# Patient Record
Sex: Female | Born: 1985
Health system: Southern US, Community
[De-identification: ages and names within clinical notes are randomized; demographics above are authoritative.]

## PROBLEM LIST (undated history)

## (undated) ENCOUNTER — Inpatient Hospital Stay (HOSPITAL_COMMUNITY): Payer: Self-pay

## (undated) DIAGNOSIS — D696 Thrombocytopenia, unspecified: Secondary | ICD-10-CM

## (undated) DIAGNOSIS — N189 Chronic kidney disease, unspecified: Secondary | ICD-10-CM

## (undated) HISTORY — PX: WISDOM TOOTH EXTRACTION: SHX21

---
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
## 2006-05-12 ENCOUNTER — Emergency Department (HOSPITAL_COMMUNITY): Admission: EM | Admit: 2006-05-12 | Discharge: 2006-05-12 | Payer: Self-pay | Admitting: Emergency Medicine

## 2010-04-11 ENCOUNTER — Encounter (INDEPENDENT_AMBULATORY_CARE_PROVIDER_SITE_OTHER): Payer: Self-pay | Admitting: Obstetrics and Gynecology

## 2010-04-11 ENCOUNTER — Inpatient Hospital Stay (HOSPITAL_COMMUNITY): Admission: RE | Admit: 2010-04-11 | Discharge: 2010-04-13 | Payer: Self-pay | Admitting: Family Medicine

## 2010-12-31 LAB — CBC
HCT: 34.8 % — ABNORMAL LOW (ref 36.0–46.0)
Hemoglobin: 12.2 g/dL (ref 12.0–15.0)
Platelets: 106 10*3/uL — ABNORMAL LOW (ref 150–400)
Platelets: 99 10*3/uL — ABNORMAL LOW (ref 150–400)
RBC: 3.54 MIL/uL — ABNORMAL LOW (ref 3.87–5.11)
RDW: 13.2 % (ref 11.5–15.5)
RDW: 13.3 % (ref 11.5–15.5)
WBC: 12.4 10*3/uL — ABNORMAL HIGH (ref 4.0–10.5)
WBC: 7.3 10*3/uL (ref 4.0–10.5)
WBC: 9.8 10*3/uL (ref 4.0–10.5)

## 2011-08-17 LAB — OB RESULTS CONSOLE GC/CHLAMYDIA: Gonorrhea: NEGATIVE

## 2011-10-16 NOTE — L&D Delivery Note (Signed)
Delivery Note At 7:45 AM a viable female was delivered via OA.  APGAR 9 and 9 Weight pending Placenta status: del spontaneously and intact with 3 vessel cord.  Cord:  with the following complications: noneCord pH: not obtained  Anesthesia:  none Episiotomy: none Lacerations: none Suture Repair: not applicable Est. Blood Loss (mL): 300  Mom to postpartum.  Baby to nursery-stable.  Jahmere Bramel L 05/08/2012, 7:59 AM

## 2011-10-17 LAB — OB RESULTS CONSOLE ABO/RH: RH Type: POSITIVE

## 2011-10-29 ENCOUNTER — Other Ambulatory Visit: Payer: Self-pay

## 2012-02-07 ENCOUNTER — Other Ambulatory Visit (HOSPITAL_COMMUNITY): Payer: Self-pay | Admitting: Obstetrics and Gynecology

## 2012-02-07 DIAGNOSIS — K311 Adult hypertrophic pyloric stenosis: Secondary | ICD-10-CM

## 2012-02-08 ENCOUNTER — Ambulatory Visit (HOSPITAL_COMMUNITY)
Admission: RE | Admit: 2012-02-08 | Discharge: 2012-02-08 | Disposition: A | Discharge status: Home / Self Care | Payer: Managed Care, Other (non HMO) | Source: Ambulatory Visit | Attending: Obstetrics and Gynecology | Admitting: Obstetrics and Gynecology

## 2012-02-08 ENCOUNTER — Encounter (HOSPITAL_COMMUNITY): Payer: Self-pay

## 2012-02-08 VITALS — BP 128/81 | HR 93 | Wt 128.0 lb

## 2012-02-08 DIAGNOSIS — Z1389 Encounter for screening for other disorder: Secondary | ICD-10-CM | POA: Insufficient documentation

## 2012-02-08 DIAGNOSIS — O358XX0 Maternal care for other (suspected) fetal abnormality and damage, not applicable or unspecified: Secondary | ICD-10-CM | POA: Insufficient documentation

## 2012-02-08 DIAGNOSIS — K311 Adult hypertrophic pyloric stenosis: Secondary | ICD-10-CM

## 2012-02-08 DIAGNOSIS — Z363 Encounter for antenatal screening for malformations: Secondary | ICD-10-CM | POA: Insufficient documentation

## 2012-02-08 NOTE — Progress Notes (Signed)
Mariah Burke was seen for ultrasound appointment today.  Please see [facility] report for details.  

## 2012-02-14 ENCOUNTER — Inpatient Hospital Stay (HOSPITAL_COMMUNITY)
Admission: AD | Admit: 2012-02-14 | Discharge: 2012-02-14 | Disposition: A | Discharge status: Home / Self Care | Payer: Managed Care, Other (non HMO) | Source: Ambulatory Visit | Attending: Obstetrics and Gynecology | Admitting: Obstetrics and Gynecology

## 2012-02-14 ENCOUNTER — Encounter (HOSPITAL_COMMUNITY): Payer: Self-pay | Admitting: *Deleted

## 2012-02-14 DIAGNOSIS — Z36 Encounter for antenatal screening of mother: Secondary | ICD-10-CM

## 2012-02-14 DIAGNOSIS — O368190 Decreased fetal movements, unspecified trimester, not applicable or unspecified: Secondary | ICD-10-CM

## 2012-02-14 DIAGNOSIS — Z3689 Encounter for other specified antenatal screening: Secondary | ICD-10-CM

## 2012-02-14 NOTE — Discharge Instructions (Signed)
Fetal Movement Counts Patient Name: __________________________________________________ Patient Due Date: ____________________ Kick counts is highly recommended in high risk pregnancies, but it is a good idea for every pregnant woman to do. Start counting fetal movements at 28 weeks of the pregnancy. Fetal movements increase after eating a full meal or eating or drinking something sweet (the blood sugar is higher). It is also important to drink plenty of fluids (well hydrated) before doing the count. Lie on your left side because it helps with the circulation or you can sit in a comfortable chair with your arms over your belly (abdomen) with no distractions around you. DOING THE COUNT  Try to do the count the same time of day each time you do it.   Mark the day and time, then see how long it takes for you to feel 10 movements (kicks, flutters, swishes, rolls). You should have at least 10 movements within 2 hours. You will most likely feel 10 movements in much less than 2 hours. If you do not, wait an hour and count again. After a couple of days you will see a pattern.   What you are looking for is a change in the pattern or not enough counts in 2 hours. Is it taking longer in time to reach 10 movements?  SEEK MEDICAL CARE IF:  You feel less than 10 counts in 2 hours. Tried twice.   No movement in one hour.   The pattern is changing or taking longer each day to reach 10 counts in 2 hours.   You feel the baby is not moving as it usually does.  Date: ____________ Movements: ____________ Start time: ____________ Finish time: ____________  Date: ____________ Movements: ____________ Start time: ____________ Finish time: ____________ Date: ____________ Movements: ____________ Start time: ____________ Finish time: ____________ Date: ____________ Movements: ____________ Start time: ____________ Finish time: ____________ Date: ____________ Movements: ____________ Start time: ____________ Finish time:  ____________ Date: ____________ Movements: ____________ Start time: ____________ Finish time: ____________ Date: ____________ Movements: ____________ Start time: ____________ Finish time: ____________ Date: ____________ Movements: ____________ Start time: ____________ Finish time: ____________  Date: ____________ Movements: ____________ Start time: ____________ Finish time: ____________ Date: ____________ Movements: ____________ Start time: ____________ Finish time: ____________ Date: ____________ Movements: ____________ Start time: ____________ Finish time: ____________ Date: ____________ Movements: ____________ Start time: ____________ Finish time: ____________ Date: ____________ Movements: ____________ Start time: ____________ Finish time: ____________ Date: ____________ Movements: ____________ Start time: ____________ Finish time: ____________ Date: ____________ Movements: ____________ Start time: ____________ Finish time: ____________  Date: ____________ Movements: ____________ Start time: ____________ Finish time: ____________ Date: ____________ Movements: ____________ Start time: ____________ Finish time: ____________ Date: ____________ Movements: ____________ Start time: ____________ Finish time: ____________ Date: ____________ Movements: ____________ Start time: ____________ Finish time: ____________ Date: ____________ Movements: ____________ Start time: ____________ Finish time: ____________ Date: ____________ Movements: ____________ Start time: ____________ Finish time: ____________ Date: ____________ Movements: ____________ Start time: ____________ Finish time: ____________  Date: ____________ Movements: ____________ Start time: ____________ Finish time: ____________ Date: ____________ Movements: ____________ Start time: ____________ Finish time: ____________ Date: ____________ Movements: ____________ Start time: ____________ Finish time: ____________ Date: ____________ Movements:  ____________ Start time: ____________ Finish time: ____________ Date: ____________ Movements: ____________ Start time: ____________ Finish time: ____________ Date: ____________ Movements: ____________ Start time: ____________ Finish time: ____________ Date: ____________ Movements: ____________ Start time: ____________ Finish time: ____________  Date: ____________ Movements: ____________ Start time: ____________ Finish time: ____________ Date: ____________ Movements: ____________ Start time: ____________ Finish time: ____________ Date: ____________ Movements: ____________ Start time:   ____________ Finish time: ____________ Date: ____________ Movements: ____________ Start time: ____________ Finish time: ____________ Date: ____________ Movements: ____________ Start time: ____________ Finish time: ____________ Date: ____________ Movements: ____________ Start time: ____________ Finish time: ____________ Date: ____________ Movements: ____________ Start time: ____________ Finish time: ____________  Date: ____________ Movements: ____________ Start time: ____________ Finish time: ____________ Date: ____________ Movements: ____________ Start time: ____________ Finish time: ____________ Date: ____________ Movements: ____________ Start time: ____________ Finish time: ____________ Date: ____________ Movements: ____________ Start time: ____________ Finish time: ____________ Date: ____________ Movements: ____________ Start time: ____________ Finish time: ____________ Date: ____________ Movements: ____________ Start time: ____________ Finish time: ____________ Date: ____________ Movements: ____________ Start time: ____________ Finish time: ____________  Date: ____________ Movements: ____________ Start time: ____________ Finish time: ____________ Date: ____________ Movements: ____________ Start time: ____________ Finish time: ____________ Date: ____________ Movements: ____________ Start time: ____________ Finish  time: ____________ Date: ____________ Movements: ____________ Start time: ____________ Finish time: ____________ Date: ____________ Movements: ____________ Start time: ____________ Finish time: ____________ Date: ____________ Movements: ____________ Start time: ____________ Finish time: ____________ Date: ____________ Movements: ____________ Start time: ____________ Finish time: ____________  Date: ____________ Movements: ____________ Start time: ____________ Finish time: ____________ Date: ____________ Movements: ____________ Start time: ____________ Finish time: ____________ Date: ____________ Movements: ____________ Start time: ____________ Finish time: ____________ Date: ____________ Movements: ____________ Start time: ____________ Finish time: ____________ Date: ____________ Movements: ____________ Start time: ____________ Finish time: ____________ Date: ____________ Movements: ____________ Start time: ____________ Finish time: ____________ Document Released: 10/31/2006 Document Revised: 09/20/2011 Document Reviewed: 05/03/2009 [employer] Patient Information 2012 [employer]. 

## 2012-02-14 NOTE — MAU Provider Note (Signed)
Chief Complaint:  Decreased Fetal Movement   First Provider Initiated Contact with Patient 02/14/12 2025      HPI  Mariah Burke is  26 y.o. G2P1001 at [redacted]w[redacted]d presents with decreased fetal movement today.  She tried drinking fluids and lying down at home but still was feeling very few movements.  She felt some movement upon arrival in MAU tonight and is feeling movement currently.  She denies LOF, vaginal bleeding, vaginal itching/burning, urinary symptoms, h/a, dizziness, n/v, or fever/chills.    Pregnancy Course: uncomplicated  Past Medical History: Past Medical History  Diagnosis Date  . No pertinent past medical history     Past Surgical History: Past Surgical History  Procedure Date  . Wisdom tooth extraction     Family History: Family History  Problem Relation Age of Onset  . Anesthesia problems Neg Hx     Social History: History  Substance Use Topics  . Smoking status: Never Smoker   . Smokeless tobacco: Never Used  . Alcohol Use: No    Allergies: No Known Allergies  Meds:  Prescriptions prior to admission  Medication Sig Dispense Refill  . Prenatal Vit-Fe Fumarate-FA (PRENATAL MULTIVITAMIN) TABS Take 1 tablet by mouth every morning.          Physical Exam  Blood pressure 135/77, pulse 94, temperature 98 F (36.7 C), temperature source Oral, resp. rate 20, height 5\' 3"  (1.6 m), weight 57.153 kg (126 lb), last menstrual period 08/15/2011. GENERAL: Well-developed, well-nourished female in no acute distress.  HEENT: normocephalic, good dentition HEART: normal rate RESP: normal effort ABDOMEN: Soft, nontender, nondistended, gravid.  EXTREMITIES: Nontender, no edema NEURO: alert and oriented  SPECULUM EXAM: Deferred    FHT:  Baseline 150, moderate variability, accelerations present, no decelerations Contractions: None   Labs: Not indicated  Imaging:  Not indicated  Assessment: Reactive NST   Plan: Called Dr Marcelle Overlie to review assessment and  tracing D/C home Reviewed fetal kick counts F/U in office Return to MAU as needed   LEFTWICH-KIRBY, Phylis Javed 5/2/20138:26 PM

## 2012-02-14 NOTE — MAU Note (Signed)
Decreased fetal movement today.  Felt it this morning, just not as much during the day.  Since arrived has felt some.

## 2012-03-14 ENCOUNTER — Ambulatory Visit (HOSPITAL_COMMUNITY)
Admission: RE | Admit: 2012-03-14 | Discharge: 2012-03-14 | Disposition: A | Discharge status: Home / Self Care | Payer: Managed Care, Other (non HMO) | Source: Ambulatory Visit | Attending: Obstetrics and Gynecology | Admitting: Obstetrics and Gynecology

## 2012-03-14 DIAGNOSIS — O358XX0 Maternal care for other (suspected) fetal abnormality and damage, not applicable or unspecified: Secondary | ICD-10-CM | POA: Insufficient documentation

## 2012-03-14 DIAGNOSIS — Z3689 Encounter for other specified antenatal screening: Secondary | ICD-10-CM | POA: Insufficient documentation

## 2012-03-14 NOTE — Progress Notes (Signed)
Patient seen today  for follow up ultrasound.  See full report in [facility].  Alpha Gula, MD  The patient returns for follow up due to "enlarged stomach".  The stomach appears to be prominent, but there is no evidence of dilated bowel or polyhydramnios.  The fetal anatomic survey is otherwise within normal limits.  Single IUP at 30 2/7 weeks Interval growth is appropriate (65th %) Normal amniotic fluid volume Prominent stomach bubble, but no evidence of obstruction / polyhydramnios  Recommend follow up ultrasound as clinically indicated

## 2012-04-23 LAB — OB RESULTS CONSOLE GBS: GBS: NEGATIVE

## 2012-05-08 ENCOUNTER — Encounter (HOSPITAL_COMMUNITY): Payer: Self-pay | Admitting: *Deleted

## 2012-05-08 ENCOUNTER — Encounter (HOSPITAL_COMMUNITY): Payer: Self-pay | Admitting: Anesthesiology

## 2012-05-08 ENCOUNTER — Inpatient Hospital Stay (HOSPITAL_COMMUNITY)
Admission: AD | Admit: 2012-05-08 | Discharge: 2012-05-09 | DRG: 775 | Disposition: A | Discharge status: Home / Self Care | Payer: Managed Care, Other (non HMO) | Source: Ambulatory Visit | Attending: Obstetrics and Gynecology | Admitting: Obstetrics and Gynecology

## 2012-05-08 HISTORY — DX: Thrombocytopenia, unspecified: D69.6

## 2012-05-08 LAB — CBC
Hemoglobin: 14.4 g/dL (ref 12.0–15.0)
MCH: 31.2 pg (ref 26.0–34.0)
MCHC: 34.3 g/dL (ref 30.0–36.0)
Platelets: 107 10*3/uL — ABNORMAL LOW (ref 150–400)
RBC: 4.61 MIL/uL (ref 3.87–5.11)

## 2012-05-08 LAB — RPR: RPR Ser Ql: NONREACTIVE

## 2012-05-08 MED ORDER — LACTATED RINGERS IV SOLN: 500.0000 mL | INTRAVENOUS | Status: DC | PRN

## 2012-05-08 MED ORDER — OXYCODONE-ACETAMINOPHEN 5-325 MG PO TABS: 1.0000 | ORAL_TABLET | ORAL | Status: DC | PRN

## 2012-05-08 MED ORDER — FLEET ENEMA 7-19 GM/118ML RE ENEM: 1.0000 | ENEMA | Freq: Every day | RECTAL | Status: DC | PRN

## 2012-05-08 MED ORDER — FLEET ENEMA 7-19 GM/118ML RE ENEM: 1.0000 | ENEMA | RECTAL | Status: DC | PRN

## 2012-05-08 MED ORDER — SIMETHICONE 80 MG PO CHEW: 80.0000 mg | CHEWABLE_TABLET | ORAL | Status: DC | PRN

## 2012-05-08 MED ORDER — MEASLES, MUMPS & RUBELLA VAC ~~LOC~~ INJ
0.5000 mL | INJECTION | Freq: Once | SUBCUTANEOUS | Status: DC
  Filled 2012-05-08: qty 0.5

## 2012-05-08 MED ORDER — IBUPROFEN 600 MG PO TABS
600.0000 mg | ORAL_TABLET | Freq: Four times a day (QID) | ORAL | Status: DC
  Administered 2012-05-08 – 2012-05-09 (×4): 600 mg via ORAL
  Filled 2012-05-08 (×5): qty 1

## 2012-05-08 MED ORDER — LIDOCAINE HCL (PF) 1 % IJ SOLN
30.0000 mL | INTRAMUSCULAR | Status: DC | PRN
  Administered 2012-05-08: 30 mL via SUBCUTANEOUS
  Filled 2012-05-08: qty 30

## 2012-05-08 MED ORDER — ONDANSETRON HCL 4 MG/2ML IJ SOLN: 4.0000 mg | INTRAMUSCULAR | Status: DC | PRN

## 2012-05-08 MED ORDER — PRENATAL MULTIVITAMIN CH
1.0000 | ORAL_TABLET | Freq: Every day | ORAL | Status: DC
  Administered 2012-05-09: 1 via ORAL
  Filled 2012-05-08: qty 1

## 2012-05-08 MED ORDER — ONDANSETRON HCL 4 MG/2ML IJ SOLN: 4.0000 mg | Freq: Four times a day (QID) | INTRAMUSCULAR | Status: DC | PRN

## 2012-05-08 MED ORDER — FENTANYL 2.5 MCG/ML BUPIVACAINE 1/10 % EPIDURAL INFUSION (WH - ANES)
14.0000 mL/h | INTRAMUSCULAR | Status: DC
  Filled 2012-05-08: qty 60

## 2012-05-08 MED ORDER — CITRIC ACID-SODIUM CITRATE 334-500 MG/5ML PO SOLN: 30.0000 mL | ORAL | Status: DC | PRN

## 2012-05-08 MED ORDER — PHENYLEPHRINE 40 MCG/ML (10ML) SYRINGE FOR IV PUSH (FOR BLOOD PRESSURE SUPPORT)
80.0000 ug | PREFILLED_SYRINGE | INTRAVENOUS | Status: DC | PRN
  Filled 2012-05-08: qty 5

## 2012-05-08 MED ORDER — DIPHENHYDRAMINE HCL 25 MG PO CAPS: 25.0000 mg | ORAL_CAPSULE | Freq: Four times a day (QID) | ORAL | Status: DC | PRN

## 2012-05-08 MED ORDER — DIPHENHYDRAMINE HCL 50 MG/ML IJ SOLN: 12.5000 mg | INTRAMUSCULAR | Status: DC | PRN

## 2012-05-08 MED ORDER — EPHEDRINE 5 MG/ML INJ: 10.0000 mg | INTRAVENOUS | Status: DC | PRN

## 2012-05-08 MED ORDER — EPHEDRINE 5 MG/ML INJ
10.0000 mg | INTRAVENOUS | Status: DC | PRN
  Filled 2012-05-08: qty 4

## 2012-05-08 MED ORDER — WITCH HAZEL-GLYCERIN EX PADS: 1.0000 "application " | MEDICATED_PAD | CUTANEOUS | Status: DC | PRN

## 2012-05-08 MED ORDER — BENZOCAINE-MENTHOL 20-0.5 % EX AERO
1.0000 "application " | INHALATION_SPRAY | CUTANEOUS | Status: DC | PRN
  Administered 2012-05-08: 1 via TOPICAL
  Filled 2012-05-08: qty 56

## 2012-05-08 MED ORDER — OXYCODONE-ACETAMINOPHEN 5-325 MG PO TABS
1.0000 | ORAL_TABLET | ORAL | Status: DC | PRN
Start: 2012-05-08 — End: 2012-05-09

## 2012-05-08 MED ORDER — ZOLPIDEM TARTRATE 5 MG PO TABS: 5.0000 mg | ORAL_TABLET | Freq: Every evening | ORAL | Status: DC | PRN

## 2012-05-08 MED ORDER — IBUPROFEN 600 MG PO TABS
600.0000 mg | ORAL_TABLET | Freq: Four times a day (QID) | ORAL | Status: DC | PRN
  Administered 2012-05-08: 600 mg via ORAL
  Filled 2012-05-08: qty 1

## 2012-05-08 MED ORDER — OXYTOCIN BOLUS FROM INFUSION
250.0000 mL | Freq: Once | INTRAVENOUS | Status: AC
  Administered 2012-05-08: 250 mL via INTRAVENOUS
  Filled 2012-05-08: qty 500

## 2012-05-08 MED ORDER — LACTATED RINGERS IV SOLN: INTRAVENOUS | Status: DC

## 2012-05-08 MED ORDER — MEDROXYPROGESTERONE ACETATE 150 MG/ML IM SUSP: 150.0000 mg | INTRAMUSCULAR | Status: DC | PRN

## 2012-05-08 MED ORDER — LANOLIN HYDROUS EX OINT: TOPICAL_OINTMENT | CUTANEOUS | Status: DC | PRN

## 2012-05-08 MED ORDER — LACTATED RINGERS IV SOLN
500.0000 mL | Freq: Once | INTRAVENOUS | Status: AC
  Administered 2012-05-08: 1000 mL via INTRAVENOUS

## 2012-05-08 MED ORDER — OXYTOCIN 40 UNITS IN LACTATED RINGERS INFUSION - SIMPLE MED
62.5000 mL/h | Freq: Once | INTRAVENOUS | Status: AC
  Administered 2012-05-08: 62.5 mL/h via INTRAVENOUS
  Filled 2012-05-08: qty 1000

## 2012-05-08 MED ORDER — TETANUS-DIPHTH-ACELL PERTUSSIS 5-2.5-18.5 LF-MCG/0.5 IM SUSP: 0.5000 mL | Freq: Once | INTRAMUSCULAR | Status: DC

## 2012-05-08 MED ORDER — ACETAMINOPHEN 325 MG PO TABS: 650.0000 mg | ORAL_TABLET | ORAL | Status: DC | PRN

## 2012-05-08 MED ORDER — BISACODYL 10 MG RE SUPP: 10.0000 mg | Freq: Every day | RECTAL | Status: DC | PRN

## 2012-05-08 MED ORDER — ONDANSETRON HCL 4 MG PO TABS: 4.0000 mg | ORAL_TABLET | ORAL | Status: DC | PRN

## 2012-05-08 MED ORDER — PHENYLEPHRINE 40 MCG/ML (10ML) SYRINGE FOR IV PUSH (FOR BLOOD PRESSURE SUPPORT): 80.0000 ug | PREFILLED_SYRINGE | INTRAVENOUS | Status: DC | PRN

## 2012-05-08 MED ORDER — DIBUCAINE 1 % RE OINT: 1.0000 "application " | TOPICAL_OINTMENT | RECTAL | Status: DC | PRN

## 2012-05-08 MED ORDER — SENNOSIDES-DOCUSATE SODIUM 8.6-50 MG PO TABS
2.0000 | ORAL_TABLET | Freq: Every day | ORAL | Status: DC
  Administered 2012-05-08: 2 via ORAL

## 2012-05-08 NOTE — H&P (Signed)
26 year old G 2 P 1001 at 13 w 1 days presents with SROM this am. She has progressed to 7 cm and is now waiting for an epidural. PNC significant for being followed by MFM for dilation of fetal stomach. NKDA  Afebrile Vital signs stable Fetal heart rate is reactive General alert and oriented Lung CTAB Car RRR Abdomen is soft and non tender Cervix is 7 cm per nurse  IMPRESSION: IUP at 38 w 1 day Labor  PLAN: Epidural  Anticipate SVD

## 2012-05-08 NOTE — MAU Note (Signed)
Contractions every 2-3 mins. Water broke 420 clear fluid

## 2012-05-08 NOTE — Anesthesia Preprocedure Evaluation (Deleted)

## 2012-05-09 ENCOUNTER — Encounter (HOSPITAL_COMMUNITY): Payer: Self-pay

## 2012-05-09 LAB — CBC
HCT: 33 % — ABNORMAL LOW (ref 36.0–46.0)
MCH: 32 pg (ref 26.0–34.0)
MCHC: 34.2 g/dL (ref 30.0–36.0)
MCV: 93.5 fL (ref 78.0–100.0)
Platelets: 98 10*3/uL — ABNORMAL LOW (ref 150–400)
RDW: 13.8 % (ref 11.5–15.5)
WBC: 8.6 10*3/uL (ref 4.0–10.5)

## 2012-05-09 NOTE — Discharge Summary (Signed)
Obstetric Discharge Summary Reason for Admission: rupture of membranes Prenatal Procedures: ultrasound Intrapartum Procedures: spontaneous vaginal delivery Postpartum Procedures: none Complications-Operative and Postpartum: none Hemoglobin  Date Value Range Status  05/09/2012 11.1* 12.0 - 15.0 g/dL Final     DELTA CHECK NOTED     REPEATED TO VERIFY     HCT  Date Value Range Status  05/09/2012 33.0* 36.0 - 46.0 % Final    Physical Exam:  General: alert and cooperative Lochia: appropriate Uterine Fundus: firm Incision: perineum intact DVT Evaluation: No evidence of DVT seen on physical exam.  Discharge Diagnoses: Term Pregnancy-delivered  Discharge Information: Date: 05/09/2012 Activity: pelvic rest Diet: routine Medications: PNV Condition: stable Instructions: refer to practice specific booklet Discharge to: home RTO in 2-3 days for CBC  Newborn Data: Live born female  Birth Weight: 6 lb 11.9 oz (3060 g) APGAR: 9, 9  Home with mother.  Mariah Burke G 05/09/2012, 8:00 AM

## 2012-05-09 NOTE — Progress Notes (Signed)
Post Partum Day 1 Subjective: no complaints, up ad lib, voiding, tolerating PO and + flatus  Objective: Blood pressure 116/79, pulse 74, temperature 98.2 F (36.8 C), temperature source Oral, resp. rate 18, height 5\' 3"  (1.6 m), weight 63.504 kg (140 lb), last menstrual period 08/15/2011, SpO2 98.00%, unknown if currently breastfeeding.  Physical Exam:  General: alert and cooperative Lochia: appropriate Uterine Fundus: firm Incision: perineum intact DVT Evaluation: No evidence of DVT seen on physical exam.   Basename 05/09/12 0525 05/08/12 0652  HGB 11.1* 14.4  HCT 33.0* 42.0    Assessment/Plan: Discharge home   LOS: 1 day   Allyana Vogan G 05/09/2012, 7:51 AM

## 2012-05-14 ENCOUNTER — Inpatient Hospital Stay (HOSPITAL_COMMUNITY): Admission: RE | Admit: 2012-05-14 | Payer: Managed Care, Other (non HMO) | Source: Ambulatory Visit

## 2014-08-16 ENCOUNTER — Encounter (HOSPITAL_COMMUNITY): Payer: Self-pay

## 2014-10-15 DIAGNOSIS — D696 Thrombocytopenia, unspecified: Secondary | ICD-10-CM

## 2014-10-15 HISTORY — DX: Thrombocytopenia, unspecified: D69.6

## 2014-10-15 NOTE — L&D Delivery Note (Signed)
Delivery Note At  a viable female was delivered via NSSD (Presentation:OA ;  ).  APGAR:8/9 , ; weight  .   Placenta status: Intact, Spontaneous.3 vessel  Cord:  with the following complications:none .  Cord pH: na  Anesthesia:none   Episiotomy:  none Lacerations:  none Suture Repair: na Est. Blood Loss (mL): 350 cc   Mom to postpartum.  Baby to Couplet care / Skin to Skin.  Noa Constante S 08/02/2015, 12:17 AM

## 2014-11-17 ENCOUNTER — Encounter (HOSPITAL_COMMUNITY): Payer: Self-pay | Admitting: Emergency Medicine

## 2014-11-17 ENCOUNTER — Emergency Department (HOSPITAL_COMMUNITY)
Admission: EM | Admit: 2014-11-17 | Discharge: 2014-11-17 | Disposition: A | Discharge status: Home / Self Care | Payer: Self-pay | Source: Home / Self Care | Attending: Emergency Medicine | Admitting: Emergency Medicine

## 2014-11-17 ENCOUNTER — Emergency Department (INDEPENDENT_AMBULATORY_CARE_PROVIDER_SITE_OTHER): Payer: Self-pay

## 2014-11-17 DIAGNOSIS — J189 Pneumonia, unspecified organism: Secondary | ICD-10-CM

## 2014-11-17 MED ORDER — AZITHROMYCIN 250 MG PO TABS: 250.0000 mg | ORAL_TABLET | Freq: Every day | ORAL | Status: DC

## 2014-11-17 NOTE — ED Notes (Signed)
C/o cold sx onset 1 month but last 2 weeks have been getting worse Started out w/a dry cough on 1/3; left side started to hurt due to cough Sx also include SOB, runny nose, facial pressure, BA, fevers Taking OTC ibup/tyle w/temp relief Alert, no signs of acute distress.

## 2014-11-17 NOTE — ED Provider Notes (Signed)
CSN: [id number]     Arrival date & time 11/17/14  1223 History   First MD Initiated Contact with Patient 11/17/14 1245     Chief Complaint  Patient presents with  . URI   (Consider location/radiation/quality/duration/timing/severity/associated sxs/prior Treatment) HPI Comments: Patient reports she has had a cough for about 5 weeks. States initially symptoms began as common cold with cough, rhinorrhea, fatigue, myalgias. These symptoms lasted about a week to 10 days and then improved with exception of persistent cough. Now states that over past 1 week she feels unwell again with return of fatigue and myalgias, fever and worsening cough. Reports daughters ill with same about 2 weeks ago.  Non smoker Has not sought evaluated by PCP No flu shot this year.   Patient is a 29 y.o. female presenting with URI. The history is provided by the patient.  URI Presenting symptoms: congestion, cough, fatigue, fever and rhinorrhea     Past Medical History  Diagnosis Date  . Thrombocytopenia    Past Surgical History  Procedure Laterality Date  . Wisdom tooth extraction     Family History  Problem Relation Age of Onset  . Anesthesia problems Neg Hx    History  Substance Use Topics  . Smoking status: Never Smoker   . Smokeless tobacco: Never Used  . Alcohol Use: No   OB History    Gravida Para Term Preterm AB TAB SAB Ectopic Multiple Living   2 2 2  0 0 0 0 0 0 2     Review of Systems  Constitutional: Positive for fever, chills and fatigue.  HENT: Positive for congestion and rhinorrhea.   Respiratory: Positive for cough and shortness of breath.   Cardiovascular: Negative for chest pain.  Gastrointestinal: Negative.   Skin: Negative.     Allergies  Review of patient's allergies indicates no known allergies.  Home Medications   Prior to Admission medications   Medication Sig Start Date End Date Taking? Authorizing Provider  calcium carbonate (TUMS - DOSED IN MG ELEMENTAL CALCIUM) 500  MG chewable tablet Chew 1 tablet by mouth daily as needed. For heartburn    Historical Provider, MD  Prenatal Vit-Fe Fumarate-FA (PRENATAL MULTIVITAMIN) TABS Take 1 tablet by mouth every morning.    Historical Provider, MD   BP 125/77 mmHg  Pulse 102  Temp(Src) 98.4 F (36.9 C) (Oral)  Resp 12  SpO2 100%  LMP 10/27/2014 Physical Exam  Constitutional: She is oriented to person, place, and time. She appears well-developed and well-nourished. No distress.  HENT:  Head: Normocephalic and atraumatic.  Right Ear: Hearing, tympanic membrane, external ear and ear canal normal.  Left Ear: Hearing, tympanic membrane, external ear and ear canal normal.  Nose: Nose normal.  Mouth/Throat: Uvula is midline, oropharynx is clear and moist and mucous membranes are normal.  Eyes: Conjunctivae are normal. No scleral icterus.  Neck: Normal range of motion. Neck supple.  Cardiovascular: Normal rate, regular rhythm and normal heart sounds.   Pulmonary/Chest: Effort normal and breath sounds normal. No respiratory distress. She has no wheezes.  Abdominal: Soft. Bowel sounds are normal. She exhibits no distension. There is no tenderness.  Musculoskeletal: Normal range of motion.  Lymphadenopathy:    She has no cervical adenopathy.  Neurological: She is alert and oriented to person, place, and time.  Skin: Skin is warm and dry. No rash noted. No erythema.  Psychiatric: She has a normal mood and affect. Her behavior is normal.  Nursing note and vitals reviewed.  ED Course  Procedures (including critical care time) Labs Review Labs Reviewed  BORDETELLA PERTUSSIS PCR    Imaging Review Dg Chest 2 View  11/17/2014   CLINICAL DATA:  cough for five weeks, then a cold set in two weeks ago, fever last night 100.7. Non-smoker. Patient is not a diabetic. No history of cardiac or respiratory disease.  EXAM: CHEST - 2 VIEW  COMPARISON:  None available  FINDINGS: Lungs are hyperinflated with somewhat attenuated  coarse bronchovascular markings. Patchy left infrahilar airspace disease, partially obscuring the left diaphragmatic leaflet on the lateral projection. No overt edema. Heart size normal. No pneumothorax. No effusion. Visualized skeletal structures are unremarkable.  IMPRESSION: 1. Patchy left infrahilar airspace opacity, possibly early pneumonia. 2. Hyperinflation with coarse chronic appearing interstitial changes.   Electronically Signed   By: Arne Cleveland M.D.   On: 11/17/2014 13:42     MDM   1. CAP (community acquired pneumonia)   Given length of illness, swab sent to lab for pertussis. Patient to begin treatment for early CAP with 5 day course of azithromycin. Advised Delsym as directed on packaging for cough and PCP follow up if no improvement.     Lutricia Feil, [place] 11/17/14 1359

## 2014-11-17 NOTE — Discharge Instructions (Signed)

## 2014-11-22 LAB — BORDETELLA PERTUSSIS PCR
B parapertussis, DNA: NOT DETECTED
B pertussis, DNA: NOT DETECTED

## 2014-12-29 LAB — OB RESULTS CONSOLE RPR: RPR: NONREACTIVE

## 2014-12-29 LAB — OB RESULTS CONSOLE HIV ANTIBODY (ROUTINE TESTING): HIV: NONREACTIVE

## 2014-12-29 LAB — OB RESULTS CONSOLE ANTIBODY SCREEN: ANTIBODY SCREEN: NEGATIVE

## 2014-12-29 LAB — OB RESULTS CONSOLE ABO/RH: RH Type: POSITIVE

## 2014-12-29 LAB — OB RESULTS CONSOLE RUBELLA ANTIBODY, IGM: RUBELLA: IMMUNE

## 2014-12-29 LAB — OB RESULTS CONSOLE HEPATITIS B SURFACE ANTIGEN: Hepatitis B Surface Ag: NEGATIVE

## 2015-01-13 ENCOUNTER — Other Ambulatory Visit: Payer: Self-pay | Admitting: Obstetrics and Gynecology

## 2015-01-13 LAB — OB RESULTS CONSOLE GC/CHLAMYDIA
CHLAMYDIA, DNA PROBE: NEGATIVE
GC PROBE AMP, GENITAL: NEGATIVE

## 2015-01-14 LAB — CYTOLOGY - PAP

## 2015-07-18 ENCOUNTER — Telehealth: Payer: Self-pay | Admitting: Hematology

## 2015-07-18 ENCOUNTER — Ambulatory Visit (HOSPITAL_BASED_OUTPATIENT_CLINIC_OR_DEPARTMENT_OTHER): Payer: Self-pay | Admitting: Hematology

## 2015-07-18 ENCOUNTER — Encounter: Payer: Self-pay | Admitting: Hematology

## 2015-07-18 ENCOUNTER — Other Ambulatory Visit (HOSPITAL_BASED_OUTPATIENT_CLINIC_OR_DEPARTMENT_OTHER): Payer: Managed Care, Other (non HMO)

## 2015-07-18 VITALS — BP 125/73 | HR 97 | Temp 98.2°F | Resp 20 | Ht 63.0 in | Wt 160.9 lb

## 2015-07-18 DIAGNOSIS — O99113 Other diseases of the blood and blood-forming organs and certain disorders involving the immune mechanism complicating pregnancy, third trimester: Secondary | ICD-10-CM

## 2015-07-18 DIAGNOSIS — D696 Thrombocytopenia, unspecified: Secondary | ICD-10-CM

## 2015-07-18 LAB — CBC & DIFF AND RETIC
BASO%: 0.1 % (ref 0.0–2.0)
Basophils Absolute: 0 10*3/uL (ref 0.0–0.1)
EOS ABS: 0 10*3/uL (ref 0.0–0.5)
EOS%: 0.4 % (ref 0.0–7.0)
HCT: 38.3 % (ref 34.8–46.6)
HEMOGLOBIN: 13.3 g/dL (ref 11.6–15.9)
IMMATURE RETIC FRACT: 3.9 % (ref 1.60–10.00)
LYMPH%: 23.6 % (ref 14.0–49.7)
MCH: 32.1 pg (ref 25.1–34.0)
MCHC: 34.7 g/dL (ref 31.5–36.0)
MCV: 92.5 fL (ref 79.5–101.0)
MONO#: 0.6 10*3/uL (ref 0.1–0.9)
MONO%: 7.9 % (ref 0.0–14.0)
NEUT#: 4.7 10*3/uL (ref 1.5–6.5)
NEUT%: 68 % (ref 38.4–76.8)
PLATELETS: 105 10*3/uL — AB (ref 145–400)
RBC: 4.14 10*6/uL (ref 3.70–5.45)
RDW: 14 % (ref 11.2–14.5)
Retic %: 3.1 % — ABNORMAL HIGH (ref 0.70–2.10)
Retic Ct Abs: 128.34 10*3/uL — ABNORMAL HIGH (ref 33.70–90.70)
WBC: 6.9 10*3/uL (ref 3.9–10.3)
lymph#: 1.6 10*3/uL (ref 0.9–3.3)

## 2015-07-18 LAB — LACTATE DEHYDROGENASE (CC13): LDH: 184 U/L (ref 125–245)

## 2015-07-18 LAB — TECHNOLOGIST REVIEW

## 2015-07-18 LAB — COMPREHENSIVE METABOLIC PANEL (CC13)
ALBUMIN: 3.1 g/dL — AB (ref 3.5–5.0)
ALK PHOS: 115 U/L (ref 40–150)
ALT: 15 U/L (ref 0–55)
ANION GAP: 7 meq/L (ref 3–11)
AST: 27 U/L (ref 5–34)
BILIRUBIN TOTAL: 0.47 mg/dL (ref 0.20–1.20)
BUN: 4.5 mg/dL — ABNORMAL LOW (ref 7.0–26.0)
CO2: 25 mEq/L (ref 22–29)
Calcium: 9.4 mg/dL (ref 8.4–10.4)
Chloride: 108 mEq/L (ref 98–109)
Creatinine: 0.7 mg/dL (ref 0.6–1.1)
Glucose: 87 mg/dl (ref 70–140)
Potassium: 3.6 mEq/L (ref 3.5–5.1)
Sodium: 140 mEq/L (ref 136–145)
TOTAL PROTEIN: 6.1 g/dL — AB (ref 6.4–8.3)

## 2015-07-18 LAB — OB RESULTS CONSOLE GBS: GBS: POSITIVE

## 2015-07-18 LAB — CHCC SMEAR

## 2015-07-18 NOTE — Telephone Encounter (Signed)
Sent pt back to labs and per 10/03 POF pt to return when needed.... KJ

## 2015-07-18 NOTE — Progress Notes (Signed)
Marland Kitchen    HEMATOLOGY/ONCOLOGY CONSULTATION NOTE  Date of Service: 07/18/2015  Patient Care Team: Marylynn Pearson, MD as PCP - General (Obstetrics and Gynecology)  CHIEF COMPLAINTS/PURPOSE OF CONSULTATION:  Thrombocytopenia  HISTORY OF PRESENTING ILLNESS:   Odessa Nishi is a wonderful 29 y.o. female who has been referred to [place] by Dr .Marylynn Pearson, MD for evaluation and management of thrombocytopenia.  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 Based on available labs she had normal platelet count of  157,000 in her first trimester of her second pregnancy. Her platelet count had dropped to the low 100,000 range (105000-113,000) during the last month before delivery which was consistent with gestational thrombocytopenia. Her gestational thrombocytopenia with her first and second pregnancy did not require any specific treatments. She did have an epidural analgesia with her first pregnancy but did not require it for her second. She is currently nearly [redacted] weeks pregnant with an EDD of 08/03/2015. Her blood counts with her OB doctor showed platelet counts of 97,000 on 07/08/2015, 102000 on 05/18/2015 and 142,000 on 12/29/2014. She has had no issues with headaches, increased abdominal pain, bleeding, anemia, abnormal liver functions or uncontrolled hypertension. She notes that there has been no issue with intrauterine growth retardation. She otherwise feels quite healthy.  She does note having a "walking pneumonia" in February 2016. She notes that she was also diagnosed with a urinary tract infection around 2 weeks ago when she had some hematuria and there was some concern that she might have passed a stone. She was treated with antibiotics for 5 days. Notes that the CBC was done about the same time and showed slightly lower platelets of 97,000.  She notes that she might want to consider an epidural analgesia if it were safe to do and she needed it  but is not keen on taking any significant medications to increase her platelets if it is not necessary.  No other acute symptoms at this time. Patient is taking Tums when necessary and prenatal vitamins but denies any other medications currently except having taken antibiotics for her urinary tract infection 2 weeks ago. She cannot recollect which antibiotics she received.  Note some bilateral pitting pedal edema but no calf pain or tenderness. She notes that her OB doctor is aware of this and has been present for several weeks now.  MEDICAL HISTORY:  Past Medical History  Diagnosis Date  . Thrombocytopenia ([place])     SURGICAL HISTORY: Past Surgical History  Procedure Laterality Date  . Wisdom tooth extraction      SOCIAL HISTORY: Social History   Social History  . Marital Status: Married    Spouse Name: N/A  . Number of Children: N/A  . Years of Education: N/A   Occupational History  . Not on file.   Social History Main Topics  . Smoking status: Never Smoker   . Smokeless tobacco: Never Used  . Alcohol Use: No  . Drug Use: No  . Sexual Activity: Yes    Birth Control/ Protection: None   Other Topics Concern  . Not on file   Social History Narrative   G3 P2 Vaginal delivery in 2011 and 2013. Currently pregnant with expected date of delivery 08/03/2015.  FAMILY HISTORY: Family History  Problem Relation Age of Onset  . Anesthesia problems Neg Hx     ALLERGIES:  has No Known Allergies.  MEDICATIONS:  Current Outpatient Prescriptions  Medication Sig Dispense Refill  . calcium  carbonate (TUMS - DOSED IN MG ELEMENTAL CALCIUM) 500 MG chewable tablet Chew 1 tablet by mouth daily as needed. For heartburn    . Prenatal Vit-Fe Fumarate-FA (PRENATAL MULTIVITAMIN) TABS Take 1 tablet by mouth every morning.     No current facility-administered medications for this visit.    REVIEW OF SYSTEMS:    10 Point review of Systems was done is negative except as noted  above.  PHYSICAL EXAMINATION: ECOG PERFORMANCE STATUS: 0 - Asymptomatic  . Filed Vitals:   07/18/15 1112  Height: 5\' 3"  (1.6 m)  Weight: 160 lb 14.4 oz (72.984 kg)   Filed Weights   07/18/15 1112  Weight: 160 lb 14.4 oz (72.984 kg)   .Body mass index is 28.51 kg/(m^2).  GENERAL:alert, in no acute distress and comfortable SKIN: skin color, texture, turgor are normal, no rashes or significant lesions EYES: normal, conjunctiva are pink and non-injected, sclera clear OROPHARYNX:no exudate, no erythema and lips, buccal mucosa, and tongue normal  NECK: supple, no JVD, thyroid normal size, non-tender, without nodularity LYMPH:  no palpable lymphadenopathy in the cervical, axillary or inguinal LUNGS: clear to auscultation with normal respiratory effort HEART: regular rate & rhythm,  no murmurs and no lower extremity edema ABDOMEN: abdomen distended with gravid uterus. No palpable splenomegaly. Musculoskeletal: no cyanosis of digits and no clubbing  PSYCH: alert & oriented x 3 with fluent speech NEURO: no focal motor/sensory deficits  LABORATORY DATA:  I have reviewed the data as listed  . CBC Latest Ref Rng 07/18/2015 05/09/2012 05/08/2012  WBC 3.9 - 10.3 10e3/uL 6.9 8.6 8.7  Hemoglobin 11.6 - 15.9 g/dL 13.3 11.1(L) 14.4  Hematocrit 34.8 - 46.6 % 38.3 33.0(L) 42.0  Platelets 145 - 400 10e3/uL 105(L) 98(L) 107(L)    . CMP Latest Ref Rng 07/18/2015  Glucose 70 - 140 mg/dl 87  BUN 7.0 - 26.0 mg/dL 4.5(L)  Creatinine 0.6 - 1.1 mg/dL 0.7  Sodium 136 - 145 mEq/L 140  Potassium 3.5 - 5.1 mEq/L 3.6  CO2 22 - 29 mEq/L 25  Calcium 8.4 - 10.4 mg/dL 9.4  Total Protein 6.4 - 8.3 g/dL 6.1(L)  Total Bilirubin 0.20 - 1.20 mg/dL 0.47  Alkaline Phos 40 - 150 U/L 115  AST 5 - 34 U/L 27  ALT 0 - 55 U/L 15   . Lab Results  Component Value Date   LDH 184 07/18/2015      RADIOGRAPHIC STUDIES: I have personally reviewed the radiological images as listed and agreed with the findings in  the report. No results found.  ASSESSMENT & PLAN:   29 year old Caucasian female who is about [redacted] weeks pregnant referred for evaluation of thrombocytopenia.  #1 Thrombocytopenia - mild. Likely gestational thrombocytopenia given the timing and time course.  No evidence of significant anemia, abnormal liver functions, uncontrolled hypertension to suspect any other more ominous etiology for her thrombocytopenia. Patient has had normal platelets during previous pregnancies during her first trimester and had near normal platelets during her first trimester during this current surgery pregnancy as well suggesting against the presence of an immune thrombocytopenia at baseline. She did have a urinary tract infection and received antibiotics about 2 weeks ago. Both the mild infection and antibiotics can cause some drop in her platelet counts as well. -Platelet counts rechecked today are back up to 105,000. Hemoglobin and WBC count are within normal limits. CMP within normal limits. LDH within normal limits.  Plan -Her current platelet counts of >100,000 would not be an indication for any  treatments at this time. It does not entail significant risk of bleeding with normal vaginal delivery or even a C-section. -In most situations one could fairly safely have an epidural analgesia with platelet counts of more than 75,000 in the absence of any antiplatelet therapy, other coagulopathy or anticoagulants. -If anesthesiology is uncomfortable with platelet counts off less than 100,000 (around the time of labor) and if epidural analgesia is chosen.. Could consider doing the procedure alongside transfusion of one apheresis unit of platelets. -We'll look at the patient's peripheral blood smear to rule out other concerns. -Might be reasonable to recheck her platelets 2 months after delivery to ensure normalization of counts. -No clear role of steroids in the treatment of gestational thrombocytopenia which typically  tends to be self-limited. Also even if there were an immune component to the patients thrombocytopenia (which there likely isn't) steroids can take 2-3 weeks for response and might not be helpful in this situation.  I appreciate the privilege of helping with the care of Mrs. Stillson.  Kindly let me know if any other questions or concerns arise. All of the patients questions were answered to her apparent satisfaction. The patient knows to call the clinic with any problems, questions or concerns.  I spent 40 minutes counseling the patient face to face. The total time spent in the appointment was 55 minutes and more than 50% was on counseling and direct patient cares.    Sullivan Lone MD [place] [facility] Hematology/Oncology Physician [place]  (Office):       [phone] (Work cell):  [phone] (Fax):           [phone]  07/18/2015 11:23 AM

## 2015-07-18 NOTE — Telephone Encounter (Signed)
New patient appt-s/w patient gave np appt for 10/3 @ 11 w/Dr. Irene Limbo.  Referring Dr. Marylynn Pearson Dx- Low plts ct

## 2015-08-01 ENCOUNTER — Encounter (HOSPITAL_COMMUNITY): Payer: Self-pay | Admitting: *Deleted

## 2015-08-01 ENCOUNTER — Inpatient Hospital Stay (HOSPITAL_COMMUNITY)
Admission: AD | Admit: 2015-08-01 | Discharge: 2015-08-03 | DRG: 775 | Disposition: A | Discharge status: Home / Self Care | Payer: Self-pay | Source: Ambulatory Visit | Attending: Obstetrics and Gynecology | Admitting: Obstetrics and Gynecology

## 2015-08-01 DIAGNOSIS — D696 Thrombocytopenia, unspecified: Secondary | ICD-10-CM | POA: Diagnosis present

## 2015-08-01 DIAGNOSIS — O99824 Streptococcus B carrier state complicating childbirth: Principal | ICD-10-CM | POA: Diagnosis present

## 2015-08-01 DIAGNOSIS — IMO0001 Reserved for inherently not codable concepts without codable children: Secondary | ICD-10-CM

## 2015-08-01 DIAGNOSIS — Z3A39 39 weeks gestation of pregnancy: Secondary | ICD-10-CM

## 2015-08-01 DIAGNOSIS — O9912 Other diseases of the blood and blood-forming organs and certain disorders involving the immune mechanism complicating childbirth: Secondary | ICD-10-CM | POA: Diagnosis present

## 2015-08-01 LAB — CBC
HEMATOCRIT: 36.9 % (ref 36.0–46.0)
HEMOGLOBIN: 12.6 g/dL (ref 12.0–15.0)
MCH: 31.4 pg (ref 26.0–34.0)
MCHC: 34.1 g/dL (ref 30.0–36.0)
MCV: 92 fL (ref 78.0–100.0)
Platelets: 99 10*3/uL — ABNORMAL LOW (ref 150–400)
RBC: 4.01 MIL/uL (ref 3.87–5.11)
RDW: 14 % (ref 11.5–15.5)
WBC: 7.4 10*3/uL (ref 4.0–10.5)

## 2015-08-01 LAB — TYPE AND SCREEN
ABO/RH(D): O POS
Antibody Screen: NEGATIVE

## 2015-08-01 MED ORDER — SODIUM CHLORIDE 0.9 % IV SOLN
2.0000 g | Freq: Once | INTRAVENOUS | Status: AC
  Administered 2015-08-01: 2 g via INTRAVENOUS
  Filled 2015-08-01 (×2): qty 2000

## 2015-08-01 MED ORDER — ACETAMINOPHEN 325 MG PO TABS: 650.0000 mg | ORAL_TABLET | ORAL | Status: DC | PRN

## 2015-08-01 MED ORDER — FLEET ENEMA 7-19 GM/118ML RE ENEM: 1.0000 | ENEMA | RECTAL | Status: DC | PRN

## 2015-08-01 MED ORDER — OXYTOCIN 40 UNITS IN LACTATED RINGERS INFUSION - SIMPLE MED
62.5000 mL/h | INTRAVENOUS | Status: DC
  Filled 2015-08-01: qty 1000

## 2015-08-01 MED ORDER — LACTATED RINGERS IV SOLN: 500.0000 mL | INTRAVENOUS | Status: DC | PRN

## 2015-08-01 MED ORDER — OXYCODONE-ACETAMINOPHEN 5-325 MG PO TABS: 2.0000 | ORAL_TABLET | ORAL | Status: DC | PRN

## 2015-08-01 MED ORDER — OXYCODONE-ACETAMINOPHEN 5-325 MG PO TABS
1.0000 | ORAL_TABLET | ORAL | Status: DC | PRN
Start: 2015-08-01 — End: 2015-08-03

## 2015-08-01 MED ORDER — OXYTOCIN BOLUS FROM INFUSION: 500.0000 mL | INTRAVENOUS | Status: DC

## 2015-08-01 MED ORDER — LACTATED RINGERS IV SOLN
INTRAVENOUS | Status: DC
  Administered 2015-08-01: 21:00:00 via INTRAVENOUS

## 2015-08-01 MED ORDER — CITRIC ACID-SODIUM CITRATE 334-500 MG/5ML PO SOLN: 30.0000 mL | ORAL | Status: DC | PRN

## 2015-08-01 MED ORDER — LIDOCAINE HCL (PF) 1 % IJ SOLN
30.0000 mL | INTRAMUSCULAR | Status: DC | PRN
  Filled 2015-08-01: qty 30

## 2015-08-01 MED ORDER — ONDANSETRON HCL 4 MG/2ML IJ SOLN: 4.0000 mg | Freq: Four times a day (QID) | INTRAMUSCULAR | Status: DC | PRN

## 2015-08-01 NOTE — H&P (Signed)
Mariah Burke is a 29 y.o. female presenting at 55 weeks with sol.  Positive GBS. Maternal Medical History:  Reason for admission: Contractions.   Contractions: Onset was 1-2 hours ago.   Frequency: regular.   Perceived severity is moderate.    Fetal activity: Perceived fetal activity is normal.    Prenatal complications: thrombocytopenia  Prenatal Complications - Diabetes: none.    OB History    Gravida Para Term Preterm AB TAB SAB Ectopic Multiple Living   3 2 2  0 0 0 0 0 0 2     Past Medical History  Diagnosis Date  . Thrombocytopenia [facility])    Past Surgical History  Procedure Laterality Date  . Wisdom tooth extraction     Family History: family history is negative for Anesthesia problems. Social History:  reports that she has never smoked. She has never used smokeless tobacco. She reports that she does not drink alcohol or use illicit drugs.   Prenatal Transfer Tool  Maternal Diabetes: No Genetic Screening: Declined Maternal Ultrasounds/Referrals: Normal Fetal Ultrasounds or other Referrals:  None Maternal Substance Abuse:  No Significant Maternal Medications:  None Significant Maternal Lab Results:  Lab values include: Group B Strep positive Other Comments:  None  ROS  Dilation: 6.5 Effacement (%): 80 Station: -1, -2 Exam by:: L. Paschal, RN Last menstrual period 10/27/2014, unknown if currently breastfeeding. Maternal Exam:  Uterine Assessment: Contraction strength is moderate.  Contraction frequency is regular.   Abdomen: Patient reports no abdominal tenderness. Fundal height is c/w dates.   Fetal presentation: vertex  Pelvis: adequate for delivery.   Cervix: 6 cm 50 % vtx -1  Physical Exam  Prenatal labs: ABO, Rh: O/Positive/-- (03/16 0000) Antibody: Negative (03/16 0000) Rubella: Immune (03/16 0000) RPR: Nonreactive (03/16 0000)  HBsAg: Negative (03/16 0000)  HIV: Non-reactive (03/16 0000)  GBS: Positive (10/03 0000)   Assessment/Plan: IUP  at term with SOL Positive GBS IV ampicillin   Josanna Hefel S 08/01/2015, 9:42 PM

## 2015-08-01 NOTE — MAU Note (Signed)
Pt states U/c's started about 1830.  Reports every 3-6 minutes.  Denies vaginal bleeding, abnormal discharge, or ROM.  Reports good fetal movement

## 2015-08-02 ENCOUNTER — Encounter (HOSPITAL_COMMUNITY): Payer: Self-pay | Admitting: *Deleted

## 2015-08-02 LAB — CBC
HCT: 36.4 % (ref 36.0–46.0)
Hemoglobin: 12.3 g/dL (ref 12.0–15.0)
MCH: 31.1 pg (ref 26.0–34.0)
MCHC: 33.8 g/dL (ref 30.0–36.0)
MCV: 91.9 fL (ref 78.0–100.0)
PLATELETS: 103 10*3/uL — AB (ref 150–400)
RBC: 3.96 MIL/uL (ref 3.87–5.11)
RDW: 14 % (ref 11.5–15.5)
WBC: 11.5 10*3/uL — AB (ref 4.0–10.5)

## 2015-08-02 LAB — RPR: RPR Ser Ql: NONREACTIVE

## 2015-08-02 MED ORDER — LANOLIN HYDROUS EX OINT: TOPICAL_OINTMENT | CUTANEOUS | Status: DC | PRN

## 2015-08-02 MED ORDER — OXYCODONE-ACETAMINOPHEN 5-325 MG PO TABS: 2.0000 | ORAL_TABLET | ORAL | Status: DC | PRN

## 2015-08-02 MED ORDER — TETANUS-DIPHTH-ACELL PERTUSSIS 5-2.5-18.5 LF-MCG/0.5 IM SUSP: 0.5000 mL | Freq: Once | INTRAMUSCULAR | Status: DC

## 2015-08-02 MED ORDER — ONDANSETRON HCL 4 MG PO TABS: 4.0000 mg | ORAL_TABLET | ORAL | Status: DC | PRN

## 2015-08-02 MED ORDER — OXYCODONE-ACETAMINOPHEN 5-325 MG PO TABS: 1.0000 | ORAL_TABLET | ORAL | Status: DC | PRN

## 2015-08-02 MED ORDER — BENZOCAINE-MENTHOL 20-0.5 % EX AERO
1.0000 "application " | INHALATION_SPRAY | CUTANEOUS | Status: DC | PRN
  Administered 2015-08-02: 1 via TOPICAL
  Filled 2015-08-02: qty 56

## 2015-08-02 MED ORDER — OXYTOCIN 10 UNIT/ML IJ SOLN
10.0000 [IU] | Freq: Once | INTRAMUSCULAR | Status: AC
  Administered 2015-08-02: 10 [IU] via INTRAMUSCULAR

## 2015-08-02 MED ORDER — IBUPROFEN 600 MG PO TABS
600.0000 mg | ORAL_TABLET | Freq: Four times a day (QID) | ORAL | Status: DC
  Administered 2015-08-02 – 2015-08-03 (×6): 600 mg via ORAL
  Filled 2015-08-02 (×6): qty 1

## 2015-08-02 MED ORDER — METHYLERGONOVINE MALEATE 0.2 MG/ML IJ SOLN
INTRAMUSCULAR | Status: AC
  Filled 2015-08-02: qty 1

## 2015-08-02 MED ORDER — INFLUENZA VAC SPLIT QUAD 0.5 ML IM SUSY
0.5000 mL | PREFILLED_SYRINGE | INTRAMUSCULAR | Status: AC
  Administered 2015-08-03: 0.5 mL via INTRAMUSCULAR
  Filled 2015-08-02: qty 0.5

## 2015-08-02 MED ORDER — METHYLERGONOVINE MALEATE 0.2 MG/ML IJ SOLN
0.2000 mg | Freq: Once | INTRAMUSCULAR | Status: AC
  Administered 2015-08-02: 0.2 mg via INTRAMUSCULAR

## 2015-08-02 MED ORDER — ONDANSETRON HCL 4 MG/2ML IJ SOLN: 4.0000 mg | INTRAMUSCULAR | Status: DC | PRN

## 2015-08-02 MED ORDER — SENNOSIDES-DOCUSATE SODIUM 8.6-50 MG PO TABS
2.0000 | ORAL_TABLET | ORAL | Status: DC
  Administered 2015-08-02: 2 via ORAL
  Filled 2015-08-02: qty 2

## 2015-08-02 MED ORDER — SIMETHICONE 80 MG PO CHEW: 80.0000 mg | CHEWABLE_TABLET | ORAL | Status: DC | PRN

## 2015-08-02 MED ORDER — OXYTOCIN 10 UNIT/ML IJ SOLN
INTRAMUSCULAR | Status: AC
  Administered 2015-08-02: 10 [IU] via INTRAMUSCULAR
  Filled 2015-08-02: qty 1

## 2015-08-02 MED ORDER — DIPHENHYDRAMINE HCL 25 MG PO CAPS: 25.0000 mg | ORAL_CAPSULE | Freq: Four times a day (QID) | ORAL | Status: DC | PRN

## 2015-08-02 MED ORDER — PRENATAL MULTIVITAMIN CH
1.0000 | ORAL_TABLET | Freq: Every day | ORAL | Status: DC
  Administered 2015-08-03: 1 via ORAL
  Filled 2015-08-02: qty 1

## 2015-08-02 MED ORDER — WITCH HAZEL-GLYCERIN EX PADS: 1.0000 "application " | MEDICATED_PAD | CUTANEOUS | Status: DC | PRN

## 2015-08-02 MED ORDER — DIBUCAINE 1 % RE OINT: 1.0000 "application " | TOPICAL_OINTMENT | RECTAL | Status: DC | PRN

## 2015-08-02 MED ORDER — ZOLPIDEM TARTRATE 5 MG PO TABS: 5.0000 mg | ORAL_TABLET | Freq: Every evening | ORAL | Status: DC | PRN

## 2015-08-02 MED ORDER — ACETAMINOPHEN 325 MG PO TABS: 650.0000 mg | ORAL_TABLET | ORAL | Status: DC | PRN

## 2015-08-02 NOTE — Lactation Note (Signed)
This note was copied from the chart of Mariah Burke. Lactation Consultation Note  Patient Name: Mariah Burke [id number] Date: 08/02/2015 Reason for consult: Follow-up assessment  2nd visit for this mom . Per mom baby finally had a wet diaper and another poop. Per mom nipples both have already got'en sore . LC assessed with moms permission. LC noted at the base of both nipples cracking and semi compressible areolas.  LC instructed mom on the use comfort gels , hand pump , and shells.  LC plan for sore nipple prevention and tx : Prior to latch - breast massage, hand express, pre-pump to make the nipple areola complex more elastic                          For a deeper latch Breast compressions with firm support until the baby is swallowing and intermittent compressions.  After feeding EBM to nipples liberally , comfort gels , alternating with breast shells ( while awake )  Sore nipples will be reassessed in am .  @ consult - LC reviewed above plan and walked mom through the steps.  Baby latched with depth - and assistance - per mom the comfort increased.  Mom is already sore ( contributing factor to soreness) Mother informed of post-discharge support and given phone number to the lactation department, including services for phone call assistance; out-patient appointments; and breastfeeding support group. List of other breastfeeding resources in the community given in the handout. Encouraged mother to call for problems or concerns related to breastfeeding.    Maternal Data Has patient been taught Hand Expression?: Yes Does the patient have breastfeeding experience prior to this delivery?: Yes  Feeding Feeding Type: Breast Fed Length of feed: 20 min (multiply swallows noted , increased breast compressions )  LATCH Score/Interventions Latch: Grasps breast easily, tongue down, lips flanged, rhythmical sucking.  Audible Swallowing: Spontaneous and intermittent  Type of Nipple:  Everted at rest and after stimulation (semi compressible areolas )  Comfort (Breast/Nipple): Filling, red/small blisters or bruises, mild/mod discomfort  Problem noted: Cracked, bleeding, blisters, bruises;Mild/Moderate discomfort Interventions  (Cracked/bleeding/bruising/blister): Expressed breast milk to nipple;Hand pump Interventions (Mild/moderate discomfort): Hand massage;Hand expression;Pre-pump if needed;Comfort gels  Hold (Positioning): Assistance needed to correctly position infant at breast and maintain latch. Intervention(s): Breastfeeding basics reviewed;Support Pillows;Position options;Skin to skin  LATCH Score: 8  Lactation Tools Discussed/Used     Consult Status Consult Status: Follow-up Date: 08/03/15 Follow-up type: In-patient    Myer Haff 08/02/2015, 1:54 PM

## 2015-08-02 NOTE — Lactation Note (Signed)
This note was copied from the chart of Mariah Tyia Binford. Lactation Consultation Note  Patient Name: Mariah Burke [id number] Date: 08/02/2015 Reason for consult: Initial assessment;Other (Comment) (room full of company , enc mom to call on nurses light for [facility] for feeding assessment )  LC assessed doc flow sheets and baby has been to the breast 6 times    Maternal Data Does the patient have breastfeeding experience prior to this delivery?: Yes  Feeding Feeding Type:  (per mom baby last fed at 0900 and has been sleeping ) Length of feed: 25 min  LATCH Score/Interventions                Intervention(s): Breastfeeding basics reviewed     Lactation Tools Discussed/Used     Consult Status Consult Status: Follow-up Date: 08/02/15 Follow-up type: In-patient    Myer Haff 08/02/2015, 12:51 PM

## 2015-08-02 NOTE — Progress Notes (Signed)
Post Partum Day 0 Subjective: no complaints, up ad lib, voiding and tolerating PO  Objective: Blood pressure 121/70, pulse 79, temperature 98.2 F (36.8 C), temperature source Oral, resp. rate 18, height 5\' 3"  (1.6 m), weight 160 lb (72.576 kg), last menstrual period 10/27/2014, SpO2 100 %, unknown if currently breastfeeding.  Physical Exam:  General: alert and cooperative Lochia: appropriate Uterine Fundus: firm Incision: perineum intact DVT Evaluation: No evidence of DVT seen on physical exam. Negative Homan's sign. No cords or calf tenderness. No significant calf/ankle edema.   Recent Labs  08/01/15 2125 08/02/15 0550  HGB 12.6 12.3  HCT 36.9 36.4    Assessment/Plan: Plan for discharge tomorrow   LOS: 1 day   Khing Belcher G 08/02/2015, 7:48 AM

## 2015-08-03 NOTE — Discharge Summary (Signed)
Obstetric Discharge Summary Reason for Admission: onset of labor Prenatal Procedures: ultrasound Intrapartum Procedures: spontaneous vaginal delivery Postpartum Procedures: none Complications-Operative and Postpartum: none HEMOGLOBIN  Date Value Ref Range Status  08/02/2015 12.3 12.0 - 15.0 Burke/dL Final   HGB  Date Value Ref Range Status  07/18/2015 13.3 11.6 - 15.9 Burke/dL Final   HCT  Date Value Ref Range Status  08/02/2015 36.4 36.0 - 46.0 % Final  07/18/2015 38.3 34.8 - 46.6 % Final    Physical Exam:  General: alert and cooperative Lochia: appropriate Uterine Fundus: firm Incision: perineum intact DVT Evaluation: No evidence of DVT seen on physical exam. Negative Homan's sign. No cords or calf tenderness. No significant calf/ankle edema.  Discharge Diagnoses: Term Pregnancy-delivered  Discharge Information: Date: 08/03/2015 Activity: pelvic rest Diet: routine Medications: PNV and Ibuprofen Condition: stable Instructions: refer to practice specific booklet Discharge to: home   Newborn Data: Live born female  Birth Weight: 7 lb 0.8 oz (3198 Burke) APGAR: 9, 9  Home with mother.  Mariah Burke 08/03/2015, 8:04 AM

## 2015-08-03 NOTE — Progress Notes (Signed)
UR chart review completed.  

## 2015-08-03 NOTE — Lactation Note (Addendum)
This note was copied from the chart of Mariah Windie Marasco. Lactation Consultation Note  Patient Name: Mariah Burke [id number] Date: 08/03/2015 Reason for consult: Follow-up assessment;Breast/nipple pain (see LC note )  Baby is 52 hours old and has been to the breast consistent;y , per mom baby cluster fed all night.  3% weight loss, Breast feeding range 20 -60 mins  , Latch scores - 8-10.  Bili check -1.7 at 24 hours  , Voids and stools adequate for age. Per mom nipples still sore with latching , but not worse than yesterday,comfort gels are helping.  Mom was instructed yesterday on the preventive measures of sore nipples and latching.  Mom reports breast are heavier and fuller, milk easier to express. LC reminded mom to apply  EBM to cracking liberally, also prior to latch to drip EBM in the baby's mouth as an appetizer to calm  Her mouth down, and work with baby to open wide before latching.  Sore nipple and engorgement prevention and tx reviewed.  Per mom has a DEBP at home. LC recommending adding 2-3 post pumping 10 mins both breast  To enhance volume increasing so if the soreness is prolonged EBM will be available. Mother informed of post-discharge support and given phone number to the lactation department, including  services for phone call assistance; out-patient appointments; and breastfeeding support group. List of other  breastfeeding resources in the community given in the handout. Encouraged mother to call for problems or concerns related to breastfeeding. Mom wants early D/C .    Maternal Data Has patient been taught Hand Expression?: Yes Does the patient have breastfeeding experience prior to this delivery?: Yes  Feeding    LATCH Score/Interventions                Intervention(s): Breastfeeding basics reviewed     Lactation Tools Discussed/Used Tools: Shells;Pump;Comfort gels Shell Type: Inverted Breast pump type: Manual WIC Program: No Pump Review:  Setup, frequency, and cleaning Initiated by:: MAI  Date initiated:: 08/02/15   Consult Status Consult Status: Complete Date: 08/03/15    Mariah Burke 08/03/2015, 9:04 AM

## 2015-11-22 MED FILL — NORETHINDRONE 0.35 MG TAB: 0.35 | 28 days supply | Qty: 28 | Fill #0

## 2016-08-14 MED FILL — DOXYCYCLINE HYC 100 MG CAP: 100 | 2 days supply | Qty: 2 | Fill #0

## 2016-08-14 MED FILL — OXYCODONE W/APAP 5/325 TAB: 5-325 | 3 days supply | Qty: 10 | Fill #0

## 2016-08-14 MED FILL — ALPRAZolam 0.5 MG TABS: 0.5 | 2 days supply | Qty: 4 | Fill #0

## 2016-08-14 MED FILL — PROMETHAZINE 25 MG TABLET: 25 | 2 days supply | Qty: 12 | Fill #0

## 2016-08-14 MED FILL — IBUPROFEN 800 MG TABLET: 800 | 10 days supply | Qty: 30 | Fill #0

## 2016-08-16 ENCOUNTER — Other Ambulatory Visit: Payer: Self-pay | Admitting: Obstetrics & Gynecology

## 2016-08-16 ENCOUNTER — Encounter (HOSPITAL_COMMUNITY): Payer: Self-pay | Admitting: *Deleted

## 2016-08-16 NOTE — Anesthesia Preprocedure Evaluation (Addendum)
Anesthesia Evaluation  Patient identified by MRN, date of birth, ID band Patient awake    Reviewed: Allergy & Precautions, H&P , NPO status , Patient's Chart, lab work & pertinent test results  History of Anesthesia Complications Negative for: history of anesthetic complications  Airway Mallampati: II  TM Distance: >3 FB Neck ROM: full    Dental no notable dental hx. (+) Dental Advisory Given   Pulmonary neg pulmonary ROS,    Pulmonary exam normal        Cardiovascular negative cardio ROS Normal cardiovascular exam     Neuro/Psych negative neurological ROS  negative psych ROS   GI/Hepatic negative GI ROS, Neg liver ROS,   Endo/Other  negative endocrine ROS  Renal/GU negative Renal ROS     Musculoskeletal   Abdominal   Peds  Hematology negative hematology ROS (+)   Anesthesia Other Findings   Reproductive/Obstetrics                            Anesthesia Physical  Anesthesia Plan  ASA: II  Anesthesia Plan: General   Post-op Pain Management:    Induction: Intravenous  Airway Management Planned: LMA  Additional Equipment:   Intra-op Plan:   Post-operative Plan: Extubation in OR  Informed Consent: I have reviewed the patients History and Physical, chart, labs and discussed the procedure including the risks, benefits and alternatives for the proposed anesthesia with the patient or authorized representative who has indicated his/her understanding and acceptance.   Dental advisory given  Plan Discussed with: CRNA and Anesthesiologist  Anesthesia Plan Comments:        Anesthesia Quick Evaluation

## 2016-08-17 ENCOUNTER — Ambulatory Visit (HOSPITAL_COMMUNITY): Payer: Self-pay | Admitting: Anesthesiology

## 2016-08-17 ENCOUNTER — Encounter (HOSPITAL_COMMUNITY)
Admission: RE | Disposition: A | Discharge status: Home / Self Care | Payer: Self-pay | Source: Ambulatory Visit | Attending: Obstetrics & Gynecology

## 2016-08-17 ENCOUNTER — Ambulatory Visit (HOSPITAL_COMMUNITY)
Admission: RE | Admit: 2016-08-17 | Discharge: 2016-08-17 | Disposition: A | Discharge status: Home / Self Care | Payer: Self-pay | Source: Ambulatory Visit | Attending: Obstetrics & Gynecology | Admitting: Obstetrics & Gynecology

## 2016-08-17 ENCOUNTER — Encounter (HOSPITAL_COMMUNITY): Payer: Self-pay | Admitting: *Deleted

## 2016-08-17 DIAGNOSIS — D181 Lymphangioma, any site: Secondary | ICD-10-CM | POA: Insufficient documentation

## 2016-08-17 DIAGNOSIS — Z3A12 12 weeks gestation of pregnancy: Secondary | ICD-10-CM | POA: Insufficient documentation

## 2016-08-17 DIAGNOSIS — O021 Missed abortion: Secondary | ICD-10-CM | POA: Insufficient documentation

## 2016-08-17 HISTORY — PX: DILATION AND EVACUATION: SHX1459

## 2016-08-17 LAB — TYPE AND SCREEN
ABO/RH(D): O POS
ANTIBODY SCREEN: NEGATIVE

## 2016-08-17 LAB — CBC
HCT: 42.2 % (ref 36.0–46.0)
HEMOGLOBIN: 14.9 g/dL (ref 12.0–15.0)
MCH: 30.9 pg (ref 26.0–34.0)
MCHC: 35.3 g/dL (ref 30.0–36.0)
MCV: 87.6 fL (ref 78.0–100.0)
PLATELETS: 131 10*3/uL — AB (ref 150–400)
RBC: 4.82 MIL/uL (ref 3.87–5.11)
RDW: 13.4 % (ref 11.5–15.5)
WBC: 5.3 10*3/uL (ref 4.0–10.5)

## 2016-08-17 SURGERY — DILATION AND EVACUATION, UTERUS, SECOND TRIMESTER
Anesthesia: General | Site: Vagina

## 2016-08-17 MED ORDER — SCOPOLAMINE 1 MG/3DAYS TD PT72
MEDICATED_PATCH | TRANSDERMAL | Status: AC
  Administered 2016-08-17: 1.5 mg via TRANSDERMAL
  Filled 2016-08-17: qty 1

## 2016-08-17 MED ORDER — CEFAZOLIN SODIUM-DEXTROSE 2-3 GM-% IV SOLR
INTRAVENOUS | Status: DC | PRN
  Administered 2016-08-17: 2 g via INTRAVENOUS

## 2016-08-17 MED ORDER — KETOROLAC TROMETHAMINE 30 MG/ML IJ SOLN
INTRAMUSCULAR | Status: DC | PRN
  Administered 2016-08-17: 30 mg via INTRAVENOUS

## 2016-08-17 MED ORDER — MIDAZOLAM HCL 2 MG/2ML IJ SOLN
INTRAMUSCULAR | Status: AC
  Filled 2016-08-17: qty 2

## 2016-08-17 MED ORDER — ONDANSETRON HCL 4 MG/2ML IJ SOLN
INTRAMUSCULAR | Status: DC | PRN
Start: 2016-08-17 — End: 2016-08-17
  Administered 2016-08-17: 4 mg via INTRAVENOUS

## 2016-08-17 MED ORDER — DEXAMETHASONE SODIUM PHOSPHATE 10 MG/ML IJ SOLN
INTRAMUSCULAR | Status: DC | PRN
  Administered 2016-08-17: 4 mg via INTRAVENOUS

## 2016-08-17 MED ORDER — LACTATED RINGERS IV SOLN
INTRAVENOUS | Status: DC
  Administered 2016-08-17: 08:00:00 via INTRAVENOUS

## 2016-08-17 MED ORDER — PROPOFOL 10 MG/ML IV BOLUS
INTRAVENOUS | Status: DC | PRN
  Administered 2016-08-17: 200 mg via INTRAVENOUS

## 2016-08-17 MED ORDER — LIDOCAINE HCL 1 % IJ SOLN
INTRAMUSCULAR | Status: AC
  Filled 2016-08-17: qty 20

## 2016-08-17 MED ORDER — SCOPOLAMINE 1 MG/3DAYS TD PT72
1.0000 | MEDICATED_PATCH | Freq: Once | TRANSDERMAL | Status: DC
  Administered 2016-08-17: 1.5 mg via TRANSDERMAL

## 2016-08-17 MED ORDER — LIDOCAINE HCL (CARDIAC) 20 MG/ML IV SOLN
INTRAVENOUS | Status: DC | PRN
  Administered 2016-08-17: 50 mg via INTRAVENOUS

## 2016-08-17 MED ORDER — KETOROLAC TROMETHAMINE 30 MG/ML IJ SOLN
INTRAMUSCULAR | Status: AC
  Filled 2016-08-17: qty 1

## 2016-08-17 MED ORDER — SCOPOLAMINE 1 MG/3DAYS TD PT72: 1.0000 | MEDICATED_PATCH | Freq: Once | TRANSDERMAL | Status: DC

## 2016-08-17 MED ORDER — OXYCODONE-ACETAMINOPHEN 7.5-325 MG PO TABS: 1.0000 | ORAL_TABLET | ORAL | 0 refills | Status: DC | PRN

## 2016-08-17 MED ORDER — LIDOCAINE HCL 1 % IJ SOLN
INTRAMUSCULAR | Status: DC | PRN
  Administered 2016-08-17: 20 mL

## 2016-08-17 MED ORDER — CEFAZOLIN SODIUM-DEXTROSE 2-4 GM/100ML-% IV SOLN: 2.0000 g | INTRAVENOUS | Status: DC

## 2016-08-17 MED ORDER — FENTANYL CITRATE (PF) 100 MCG/2ML IJ SOLN
INTRAMUSCULAR | Status: DC | PRN
  Administered 2016-08-17 (×2): 50 ug via INTRAVENOUS

## 2016-08-17 MED ORDER — LACTATED RINGERS IV SOLN: INTRAVENOUS | Status: DC

## 2016-08-17 MED ORDER — PROPOFOL 10 MG/ML IV BOLUS
INTRAVENOUS | Status: AC
  Filled 2016-08-17: qty 20

## 2016-08-17 MED ORDER — SILVER NITRATE-POT NITRATE 75-25 % EX MISC
CUTANEOUS | Status: AC
  Filled 2016-08-17: qty 1

## 2016-08-17 MED ORDER — FENTANYL CITRATE (PF) 100 MCG/2ML IJ SOLN
INTRAMUSCULAR | Status: AC
  Filled 2016-08-17: qty 2

## 2016-08-17 MED ORDER — ONDANSETRON HCL 4 MG/2ML IJ SOLN
INTRAMUSCULAR | Status: AC
  Filled 2016-08-17: qty 2

## 2016-08-17 MED ORDER — MIDAZOLAM HCL 2 MG/2ML IJ SOLN
INTRAMUSCULAR | Status: DC | PRN
  Administered 2016-08-17: 2 mg via INTRAVENOUS

## 2016-08-17 MED ORDER — HYDROMORPHONE HCL 1 MG/ML IJ SOLN: 0.2500 mg | INTRAMUSCULAR | Status: DC | PRN

## 2016-08-17 MED ORDER — PROMETHAZINE HCL 25 MG/ML IJ SOLN: 6.2500 mg | INTRAMUSCULAR | Status: DC | PRN

## 2016-08-17 MED ORDER — LIDOCAINE HCL (CARDIAC) 20 MG/ML IV SOLN
INTRAVENOUS | Status: AC
  Filled 2016-08-17: qty 5

## 2016-08-17 MED ORDER — DEXAMETHASONE SODIUM PHOSPHATE 4 MG/ML IJ SOLN
INTRAMUSCULAR | Status: AC
  Filled 2016-08-17: qty 1

## 2016-08-17 SURGICAL SUPPLY — 19 items
ADAPTER VACURETTE TBG SET 14 (CANNULA) ×5 IMPLANT
CLOTH BEACON ORANGE TIMEOUT ST (SAFETY) ×3 IMPLANT
CONT PATH 16OZ SNAP LID 3702 (MISCELLANEOUS) ×3 IMPLANT
DRAPE STERI URO 9X17 APER PCH (DRAPES) IMPLANT
GLOVE BIO SURGEON STRL SZ 6.5 (GLOVE) ×2 IMPLANT
GLOVE BIO SURGEONS STRL SZ 6.5 (GLOVE) ×1
GLOVE BIOGEL PI IND STRL 7.0 (GLOVE) ×2 IMPLANT
GLOVE BIOGEL PI INDICATOR 7.0 (GLOVE) ×4
GOWN STRL REUS W/TWL LRG LVL3 (GOWN DISPOSABLE) ×6 IMPLANT
KIT BERKELEY 2ND TRIMESTER 1/2 (COLLECTOR) ×3 IMPLANT
PACK VAGINAL MINOR WOMEN LF (CUSTOM PROCEDURE TRAY) ×3 IMPLANT
PAD OB MATERNITY 4.3X12.25 (PERSONAL CARE ITEMS) ×3 IMPLANT
PAD PREP 24X48 CUFFED NSTRL (MISCELLANEOUS) ×3 IMPLANT
TOWEL OR 17X24 6PK STRL BLUE (TOWEL DISPOSABLE) ×6 IMPLANT
TUBE VACURETTE 2ND TRIMESTER (CANNULA) ×3 IMPLANT
VACURETTE 12 RIGID CVD (CANNULA) ×2 IMPLANT
VACURETTE 14MM CVD 1/2 BASE (CANNULA) ×2 IMPLANT
VACURETTE 16MM ASPIR CVD .5 (CANNULA) IMPLANT
WATER STERILE IRR 1000ML POUR (IV SOLUTION) ×3 IMPLANT

## 2016-08-17 NOTE — Anesthesia Procedure Notes (Signed)
Procedure Name: LMA Insertion Date/Time: 08/17/2016 8:31 AM Performed by: Jonna Munro Pre-anesthesia Checklist: Patient identified, Emergency Drugs available, Suction available, Patient being monitored and Timeout performed Patient Re-evaluated:Patient Re-evaluated prior to inductionOxygen Delivery Method: Circle system utilized Preoxygenation: Pre-oxygenation with 100% oxygen Intubation Type: IV induction LMA: LMA inserted LMA Size: 4.0 Number of attempts: 1 Placement Confirmation: positive ETCO2 and breath sounds checked- equal and bilateral Tube secured with: Tape Dental Injury: Teeth and Oropharynx as per pre-operative assessment

## 2016-08-17 NOTE — OR Nursing (Signed)
Pt and husband are self pay. Couple asked for pricing on FISH chromosome studies that we send to [facility]. The price is $540.00 and the husband agreed to the cost. The  specimen has been sent with the pt/husband consent and agreement to pay

## 2016-08-17 NOTE — Transfer of Care (Signed)
Immediate Anesthesia Transfer of Care Note  Patient: Omera Addeo  Procedure(s) Performed: Procedure(s): DILATATION AND EVACUATION (D&E) 2ND TRIMESTER (N/A)  Patient Location: PACU  Anesthesia Type:General  Level of Consciousness: awake, alert  and oriented  Airway & Oxygen Therapy: Patient Spontanous Breathing and Patient connected to nasal cannula oxygen  Post-op Assessment: Report given to RN and Post -op Vital signs reviewed and stable  Post vital signs: Reviewed and stable  Last Vitals:  Vitals:   08/17/16 0735  BP: 135/76  Pulse: 85  Resp: 18  Temp: 36.9 C    Last Pain:  Vitals:   08/17/16 0735  TempSrc: Oral  PainSc: 1       Patients Stated Pain Goal: 4 ([id number])  Complications: No apparent anesthesia complications

## 2016-08-17 NOTE — Anesthesia Postprocedure Evaluation (Signed)
Anesthesia Post Note  Patient: Mariah Burke  Procedure(s) Performed: Procedure(s) (LRB): DILATATION AND EVACUATION (D&E) 2ND TRIMESTER (N/A)  Patient location during evaluation: PACU Anesthesia Type: General Level of consciousness: sedated Pain management: pain level controlled Vital Signs Assessment: post-procedure vital signs reviewed and stable Respiratory status: spontaneous breathing and respiratory function stable Cardiovascular status: stable Anesthetic complications: no     Last Vitals:  Vitals:   08/17/16 0945 08/17/16 1000  BP: 109/69 123/72  Pulse: 61 69  Resp: 15 19  Temp:      Last Pain:  Vitals:   08/17/16 1000  TempSrc:   PainSc: 1    Pain Goal: Patients Stated Pain Goal: 4 (08/17/16 1000)               Sarrah Fiorenza DANIEL

## 2016-08-17 NOTE — Op Note (Signed)
08/17/2016  9:14 AM  PATIENT:  Mariah Burke  30 y.o. female  PRE-OPERATIVE DIAGNOSIS:  Missed AB at 12 weeks 6 days                                                       Cystic Hygroma  POST-OPERATIVE DIAGNOSIS:  Missed Abortion at 12 weeks 6 days                                                         Cystic Hygroma  PROCEDURE:  Procedure(s): DILATATION AND EVACUATION (D&E) with SUCTION, 2ND TRIMESTER  SURGEON:  Surgeon(s): Princess Bruins, MD  ASSISTANTS: none   ANESTHESIA:   general   PROCEDURE:  Under general anesthesia with laryngeal mask the patient is in lithotomy position. She is prepped with Betadine on the suprapubic, vulvar and vaginal areas and draped as usual. The bladder is catheterized. The vaginal exam reveals an anteverted uterus about 1213 weeks size. No adnexal mass. The patient ripened the cervix with Cytotec which is removed from from the vagina after inserting the speculum.  The anterior lip of the cervix is grasped with a tenaculum.  A paracervical block is done with lidocaine 1% a total of 20 cc at 4 and 8:00. Dilation of the cervix with Hegar dilators up to #43 without difficulty. A #12 curved suction curette is used to suction the products of conception.  Products of conception corresponding to 12+ weeks gestation are evacuated and sent to pathology. A FISH Anaploidy testing including chromosomes 13, 18 and 21 as well as X and Y will be performed.  After the products of conception were evacuated with the suction curette we did a gentle curettage of the intrauterine cavity on all surfaces with a medium size curette.  The endometrial surfaces were well felt throughout and the uterus was contracting.  We went back with the suction curet to remove any blood clots.  The tenaculum was removed from the anterior lip of the cervix. Hemostasis was adequate.  The speculum was removed. The patient was brought to recovery room in good and stable status.  Her blood group is  Rh+.  ESTIMATED BLOOD LOSS: 100 cc   Intake/Output Summary (Last 24 hours) at 08/17/16 0914 Last data filed at 08/17/16 0900  Gross per 24 hour  Intake                0 ml  Output              200 ml  Net             -200 ml     BLOOD ADMINISTERED:none   LOCAL MEDICATIONS USED:  LIDOCAINE 1% 20 cc Paracervical Block  SPECIMEN:  Products of Conception  DISPOSITION OF SPECIMEN:  Pathology and Genetics for FISH, 13, 18, 21, X, Y  COUNTS:  YES  PLAN OF CARE: Transfer to PACU  Princess Bruins MD  08/17/2016 at 9:14 am

## 2016-08-17 NOTE — Discharge Summary (Signed)
Physician Discharge Summary  Patient ID: Mariah Burke MRN: [id number] DOB/AGE: [birth year] 30 y.o.  Admit date: 08/17/2016 Discharge date: 08/17/2016  Admission Diagnoses: missed AB at 12weeks                                          Cystic Hygroma  Discharge Diagnoses: missed AB at 12weeks                                          Cystic Hygroma         Active Problems:   * No active hospital problems. *   Discharged Condition: good  Hospital Course: Outpatient  Consults: None  Treatments: surgery: D+E suction  Disposition: 01-Home or Self Care     Medication List    TAKE these medications   oxyCODONE-acetaminophen 7.5-325 MG tablet Commonly known as:  PERCOCET Take 1 tablet by mouth every 4 (four) hours as needed for severe pain.   prenatal multivitamin Tabs tablet Take 1 tablet by mouth every morning.      Follow-up Information    Ameisha Mcclellan,MARIE-LYNE, MD Follow up in 3 week(s).   Specialty:  Obstetrics and Gynecology Contact information: [address] [address] [phone]           Signed: Princess Bruins, MD 08/17/2016, 9:31 AM

## 2016-08-17 NOTE — Discharge Instructions (Addendum)
Dilation and Curettage or Vacuum Curettage, Care After Refer to this sheet in the next few weeks. These instructions provide you with information on caring for yourself after your procedure. Your health care provider may also give you more specific instructions. Your treatment has been planned according to current medical practices, but problems sometimes occur. Call your health care provider if you have any problems or questions after your procedure. WHAT TO EXPECT AFTER THE PROCEDURE After your procedure, it is typical to have light cramping and bleeding. This may last for 2 days to 2 weeks after the procedure. HOME CARE INSTRUCTIONS   Do not drive for 24 hours.  Wait 1 week before returning to strenuous activities.  Take your temperature 2 times a day for 4 days and write it down. Provide these temperatures to your health care provider if you develop a fever.  Avoid long periods of standing.  Avoid heavy lifting, pushing, or pulling. Do not lift anything heavier than 10 pounds (4.5 kg).  Limit stair climbing to once or twice a day.  Take rest periods often.  You may resume your usual diet.  Drink enough fluids to keep your urine clear or pale yellow.  Your usual bowel function should return. If you have constipation, you may:  Take a mild laxative with permission from your health care provider.  Add fruit and bran to your diet.  Drink more fluids.  Take showers instead of baths until your health care provider gives you permission to take baths.  Do not go swimming or use a hot tub until your health care provider approves.  Try to have someone with you or available to you the first 24-48 hours, especially if you were given a general anesthetic.  Do not douche, use tampons, or have sex (intercourse) for 2 weeks after the procedure.  Only take over-the-counter or prescription medicines as directed by your health care provider. Do not take aspirin. It can cause  bleeding.  Follow up with your health care provider as directed. SEEK MEDICAL CARE IF:   You have increasing cramps or pain that is not relieved with medicine.  You have abdominal pain that does not seem to be related to the same area of earlier cramping and pain.  You have bad smelling vaginal discharge.  You have a rash.  You are having problems with any medicine. SEEK IMMEDIATE MEDICAL CARE IF:   You have bleeding that is heavier than a normal menstrual period.  You have a fever.  You have chest pain.  You have shortness of breath.  You feel dizzy or feel like fainting.  You pass out.  You have pain in your shoulder strap area.  You have heavy vaginal bleeding with or without blood clots. MAKE SURE YOU:   Understand these instructions.  Will watch your condition.  Will get help right away if you are not doing well or get worse.   This information is not intended to replace advice given to you by your health care provider. Make sure you discuss any questions you have with your health care provider.   Document Released: 09/28/2000 Document Revised: 10/06/2013 Document Reviewed: 04/30/2013 Elsevier Interactive Patient Education 2016 [place] not take ibuprofen/Motrin/Advil products before 3:00pm 08/17/16.  You may remove the patch behind your ear on or before Monday 08/20/16.  Wash your hands with soap and water after contact with the patch.

## 2016-08-17 NOTE — H&P (Signed)
Mariah Burke is an 30 y.o. female [phone]  RP:  Fetal demise/Cystic Hygroma at 12 6/7 wks for D+E suction  Pertinent Gynecological History:  Blood transfusions: none Sexually transmitted diseases: no past history  Last pap wnl OB History: [id number]   Menstrual History:  Patient's last menstrual period was 05/02/2016.    Past Medical History:  Diagnosis Date  . SVD (spontaneous vaginal delivery)    x 3  . Thrombocytopenia ([place]) 2016   with 08/02/15 pregnancy only    Past Surgical History:  Procedure Laterality Date  . WISDOM TOOTH EXTRACTION      Family History  Problem Relation Age of Onset  . Anesthesia problems Neg Hx     Social History:  reports that she has never smoked. She has never used smokeless tobacco. She reports that she does not drink alcohol or use drugs.  Allergies: No Known Allergies  Prescriptions Prior to Admission  Medication Sig Dispense Refill Last Dose  . Prenatal Vit-Fe Fumarate-FA (PRENATAL MULTIVITAMIN) TABS Take 1 tablet by mouth every morning.   Past Week at Unknown time    ROS neg  Blood pressure 135/76, pulse 85, temperature 98.5 F (36.9 C), temperature source Oral, resp. rate 18, height 5\' 3"  (1.6 m), weight 128 lb (58.1 kg), last menstrual period 05/02/2016, SpO2 100 %, unknown if currently breastfeeding. Physical Exam   [place] 08/14/16  12 6/7 wks Fetal demise, FHR absent.  Cystic Hygroma.  Results for orders placed or performed during the hospital encounter of 08/17/16 (from the past 24 hour(s))  CBC     Status: Abnormal   Collection Time: 08/17/16  7:30 AM  Result Value Ref Range   WBC 5.3 4.0 - 10.5 K/uL   RBC 4.82 3.87 - 5.11 MIL/uL   Hemoglobin 14.9 12.0 - 15.0 g/dL   HCT 42.2 36.0 - 46.0 %   MCV 87.6 78.0 - 100.0 fL   MCH 30.9 26.0 - 34.0 pg   MCHC 35.3 30.0 - 36.0 g/dL   RDW 13.4 11.5 - 15.5 %   Platelets 131 (L) 150 - 400 K/uL     Assessment/Plan: 12 6/7 wks Fetal Demise/Cystic Hygroma for D+E suction.  Surgery and  risks reviewed.  Luisangel Wainright,MARIE-LYNE 08/17/2016, 7:51 AM

## 2016-08-20 ENCOUNTER — Encounter (HOSPITAL_COMMUNITY): Payer: Self-pay | Admitting: Obstetrics & Gynecology

## 2016-09-14 LAB — TISSUE HYBRIDIZATION TO NCBH

## 2016-11-15 ENCOUNTER — Encounter (HOSPITAL_COMMUNITY): Payer: Self-pay

## 2017-09-11 MED FILL — OFLOXACIN 0.3% EYE DROPS: 0.3 | 13 days supply | Qty: 5 | Fill #0

## 2018-02-10 ENCOUNTER — Inpatient Hospital Stay (HOSPITAL_COMMUNITY): Payer: Self-pay

## 2018-02-10 ENCOUNTER — Encounter (HOSPITAL_COMMUNITY): Payer: Self-pay | Admitting: *Deleted

## 2018-02-10 ENCOUNTER — Inpatient Hospital Stay (HOSPITAL_COMMUNITY)
Admission: AD | Admit: 2018-02-10 | Discharge: 2018-02-10 | Disposition: A | Discharge status: Home / Self Care | Payer: Self-pay | Source: Ambulatory Visit | Attending: Obstetrics and Gynecology | Admitting: Obstetrics and Gynecology

## 2018-02-10 ENCOUNTER — Other Ambulatory Visit: Payer: Self-pay

## 2018-02-10 DIAGNOSIS — D693 Immune thrombocytopenic purpura: Secondary | ICD-10-CM | POA: Insufficient documentation

## 2018-02-10 DIAGNOSIS — R109 Unspecified abdominal pain: Secondary | ICD-10-CM

## 2018-02-10 DIAGNOSIS — N132 Hydronephrosis with renal and ureteral calculous obstruction: Secondary | ICD-10-CM | POA: Insufficient documentation

## 2018-02-10 DIAGNOSIS — O99113 Other diseases of the blood and blood-forming organs and certain disorders involving the immune mechanism complicating pregnancy, third trimester: Secondary | ICD-10-CM | POA: Insufficient documentation

## 2018-02-10 DIAGNOSIS — O26833 Pregnancy related renal disease, third trimester: Secondary | ICD-10-CM | POA: Insufficient documentation

## 2018-02-10 DIAGNOSIS — Z3A33 33 weeks gestation of pregnancy: Secondary | ICD-10-CM | POA: Insufficient documentation

## 2018-02-10 DIAGNOSIS — O9989 Other specified diseases and conditions complicating pregnancy, childbirth and the puerperium: Secondary | ICD-10-CM

## 2018-02-10 DIAGNOSIS — O99891 Other specified diseases and conditions complicating pregnancy: Secondary | ICD-10-CM

## 2018-02-10 DIAGNOSIS — O26893 Other specified pregnancy related conditions, third trimester: Secondary | ICD-10-CM | POA: Insufficient documentation

## 2018-02-10 DIAGNOSIS — M549 Dorsalgia, unspecified: Secondary | ICD-10-CM

## 2018-02-10 HISTORY — DX: Chronic kidney disease, unspecified: N18.9

## 2018-02-10 LAB — CBC WITH DIFFERENTIAL/PLATELET
BASOS ABS: 0 10*3/uL (ref 0.0–0.1)
Basophils Relative: 0 %
Eosinophils Absolute: 0.1 10*3/uL (ref 0.0–0.7)
Eosinophils Relative: 1 %
HEMATOCRIT: 38.2 % (ref 36.0–46.0)
Hemoglobin: 13.1 g/dL (ref 12.0–15.0)
LYMPHS PCT: 23 %
Lymphs Abs: 1.7 10*3/uL (ref 0.7–4.0)
MCH: 31.4 pg (ref 26.0–34.0)
MCHC: 34.3 g/dL (ref 30.0–36.0)
MCV: 91.6 fL (ref 78.0–100.0)
MONO ABS: 0.3 10*3/uL (ref 0.1–1.0)
MONOS PCT: 3 %
NEUTROS ABS: 5.5 10*3/uL (ref 1.7–7.7)
Neutrophils Relative %: 73 %
Platelets: 99 10*3/uL — ABNORMAL LOW (ref 150–400)
RBC: 4.17 MIL/uL (ref 3.87–5.11)
RDW: 13.9 % (ref 11.5–15.5)
WBC: 7.6 10*3/uL (ref 4.0–10.5)

## 2018-02-10 LAB — URINALYSIS, ROUTINE W REFLEX MICROSCOPIC
Bilirubin Urine: NEGATIVE
GLUCOSE, UA: NEGATIVE mg/dL
Ketones, ur: NEGATIVE mg/dL
Nitrite: NEGATIVE
PH: 8 (ref 5.0–8.0)
Protein, ur: NEGATIVE mg/dL
SPECIFIC GRAVITY, URINE: 1.003 — AB (ref 1.005–1.030)

## 2018-02-10 MED ORDER — OXYCODONE-ACETAMINOPHEN 10-325 MG PO TABS
1.0000 | ORAL_TABLET | ORAL | 0 refills | Status: DC | PRN
Start: 2018-02-10 — End: 2018-02-10

## 2018-02-10 MED ORDER — OXYCODONE-ACETAMINOPHEN 10-325 MG PO TABS: 1.0000 | ORAL_TABLET | ORAL | 0 refills | Status: AC | PRN

## 2018-02-10 MED ORDER — LACTATED RINGERS IV BOLUS
1000.0000 mL | Freq: Once | INTRAVENOUS | Status: AC
  Administered 2018-02-10: 1000 mL via INTRAVENOUS

## 2018-02-10 MED ORDER — OXYCODONE-ACETAMINOPHEN 10-325 MG PO TABS: 1.0000 | ORAL_TABLET | ORAL | 0 refills | Status: DC | PRN

## 2018-02-10 MED ORDER — TAMSULOSIN HCL 0.4 MG PO CAPS: 0.4000 mg | ORAL_CAPSULE | Freq: Every day | ORAL | 0 refills | Status: AC

## 2018-02-10 MED FILL — TAMSULOSIN HCL 0.4 MG CAP: 0.4 | 30 days supply | Qty: 30 | Fill #0

## 2018-02-10 MED FILL — OXYCODONE-ACETAMINOPHEN 10-: 10-325 | 3 days supply | Qty: 30 | Fill #0

## 2018-02-10 NOTE — MAU Note (Signed)
Started having some back pain yesterday, just on rt side.  Constant. Eased some, slept last night.  Woke up worse this morning, now coming in waves.  Hx of a kidney stone, felt like this. Pain with urination now.

## 2018-02-10 NOTE — MAU Note (Signed)
Pt is a G4P2 at 33.6 weeks c/o back pain, frequency, burning and urgency with urination.  Pt  States she had a kidney stone 8 years ago and this is similar.  Pain started last night and is worse now.  No other Ob complications.

## 2018-02-10 NOTE — MAU Provider Note (Signed)
History    Mariah Burke is a 32 y.o. [phone] at [redacted]w[redacted]d with c/o back pain on L side intermittent, urgency to urinate, hesitancy. No overt blood in urine. Denies fever/chills. + FM, no LOF/VB/ctx.  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  Reports remote hx of kidney stone in pregnancy 7-8 yrs ago, none since then. Did not have stones tested for composition.   CSN: [id number]  Arrival date and time: 02/10/18 1112   None     Chief Complaint  Patient presents with  . Back Pain  . Dysuria   HPI  OB History    Gravida  5   Para  3   Term  3   Preterm  0   AB  1   Living  3     SAB  0   TAB  0   Ectopic  0   Multiple  0   Live Births  3           Past Medical History:  Diagnosis Date  . Chronic kidney disease   . SVD (spontaneous vaginal delivery)    x 3  . Thrombocytopenia ([place]) 2016   with 08/02/15 pregnancy only    Past Surgical History:  Procedure Laterality Date  . DILATION AND EVACUATION N/A 08/17/2016   Procedure: DILATATION AND EVACUATION (D&E) 2ND TRIMESTER;  Surgeon: Princess Bruins, MD;  Location: [facility];  Service: Gynecology;  Laterality: N/A;  . WISDOM TOOTH EXTRACTION      Family History  Problem Relation Age of Onset  . Anesthesia problems Neg Hx     Social History   Tobacco Use  . Smoking status: Never Smoker  . Smokeless tobacco: Never Used  Substance Use Topics  . Alcohol use: No  . Drug use: No    Allergies: No Known Allergies  Medications Prior to Admission  Medication Sig Dispense Refill Last Dose  . cholecalciferol (VITAMIN D) 1000 units tablet Take 1,000 Units by mouth daily.   Past Week at Unknown time  . lactobacillus acidophilus (BACID) TABS tablet Take 1 tablet by mouth daily.   Past Week at Unknown time  . Prenatal Vit-Fe Fumarate-FA (PRENATAL MULTIVITAMIN) TABS Take 1 tablet by mouth every morning.   Past Week at Unknown time    Review of Systems Physical Exam   Blood  pressure 117/73, pulse 87, temperature 97.9 F (36.6 C), temperature source Oral, resp. rate 16, weight 73.6 kg (162 lb 4 oz), SpO2 100 %, unknown if currently breastfeeding.  Physical Exam AAO x 3, NAD Abd: gravid, NT, S=D  Mild CVA T L flank GU deferred Ext: no edema  MAU Course  Procedures Urinalysis    Component Value Date/Time   COLORURINE STRAW (A) 02/10/2018 1136   APPEARANCEUR CLEAR 02/10/2018 1136   LABSPEC 1.003 (L) 02/10/2018 1136   PHURINE 8.0 02/10/2018 1136   GLUCOSEU NEGATIVE 02/10/2018 1136   HGBUR MODERATE (A) 02/10/2018 1136   BILIRUBINUR NEGATIVE 02/10/2018 1136   KETONESUR NEGATIVE 02/10/2018 1136   PROTEINUR NEGATIVE 02/10/2018 1136   NITRITE NEGATIVE 02/10/2018 1136   LEUKOCYTESUR TRACE (A) 02/10/2018 1136   CBC    Component Value Date/Time   WBC 7.6 02/10/2018 1223   RBC 4.17 02/10/2018 1223   HGB 13.1 02/10/2018 1223   HGB 13.3 07/18/2015 1208   HCT 38.2 02/10/2018 1223   HCT 38.3 07/18/2015 1208   PLT 99 (L) 02/10/2018 1223   PLT 105 (L) 07/18/2015 1208   MCV  91.6 02/10/2018 1223   MCV 92.5 07/18/2015 1208   MCH 31.4 02/10/2018 1223   MCHC 34.3 02/10/2018 1223   RDW 13.9 02/10/2018 1223   RDW 14.0 07/18/2015 1208   LYMPHSABS 1.7 02/10/2018 1223   LYMPHSABS 1.6 07/18/2015 1208   MONOABS 0.3 02/10/2018 1223   MONOABS 0.6 07/18/2015 1208   EOSABS 0.1 02/10/2018 1223   EOSABS 0.0 07/18/2015 1208   BASOSABS 0.0 02/10/2018 1223   BASOSABS 0.0 07/18/2015 1208   Preliminary renal sono reports shows bilateral kidney stones, 6 mm on R side and 5 mm on left, with mild hydronephrosis on R side.   MDM Mariah Burke 1 liter fluid given  Assessment and Plan  G5P3013 at [redacted]w[redacted]d  Nephrolithiasis bilateral with mild hydronephrosis FHT surveillance reassuring No s/sx of pyelo  Will DC home with precautions Lots of water hydration Flomax 0.4 mg PO daily after breakfast Percocet 5 mg/375 mg 1-2 tabs q 4 hrs PRN for pain F/U in clinic as scheduled Call if  fever > 100.0 or chills developing.   Dr. Ronita Hipps updated with findings and [name] 02/10/2018, 2:28 PM

## 2018-02-11 LAB — CULTURE, OB URINE: Culture: NO GROWTH

## 2018-03-25 ENCOUNTER — Inpatient Hospital Stay (HOSPITAL_COMMUNITY): Admit: 2018-03-25 | Payer: Self-pay

## 2018-05-19 MED FILL — PROPRANOLOL HCL ER 60 MG CP: 60 | 30 days supply | Qty: 30 | Fill #0

## 2018-06-17 ENCOUNTER — Encounter (HOSPITAL_COMMUNITY): Payer: Self-pay

## 2018-06-17 MED FILL — PROPRANOLOL HCL ER 60 MG CP: 60 | 30 days supply | Qty: 30 | Fill #0

## 2018-07-28 MED FILL — PROPRANOLOL HCL ER 60 MG CP: 60 | 30 days supply | Qty: 30 | Fill #0

## 2018-12-17 IMAGING — US US RENAL
1 series · 15 of 25 positions shown · non-contrast
Comparison: None.

CLINICAL DATA: Flank pain

EXAM:
RENAL / URINARY TRACT ULTRASOUND COMPLETE

[Series 1: us renal · 15 of 26 slices shown]
[im 1/26]
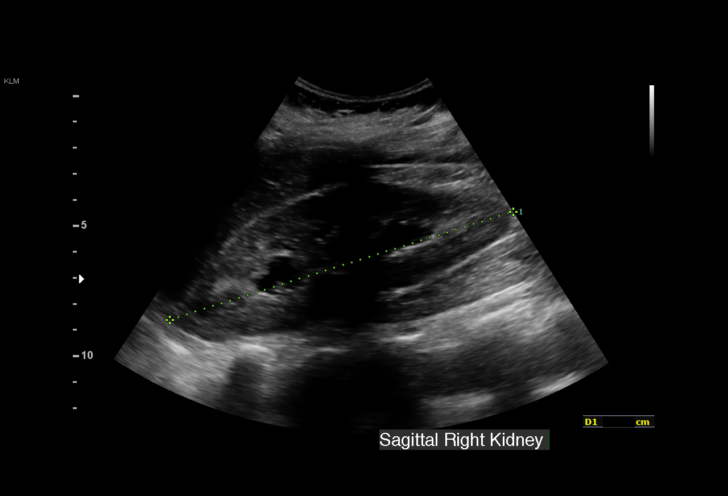
[im 3/26]
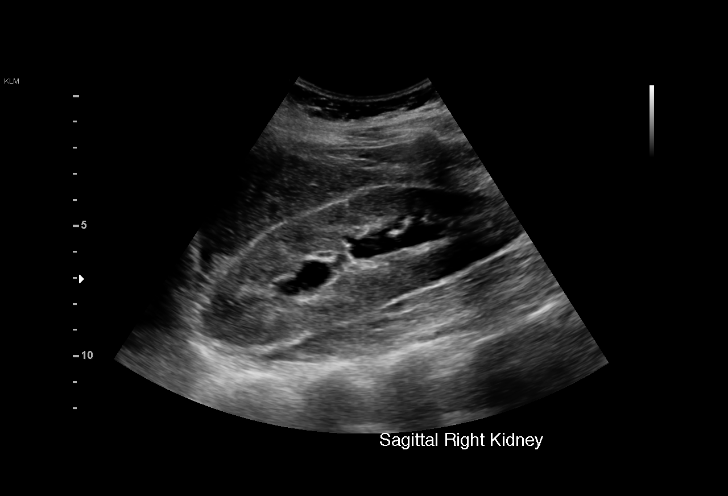
[im 5/26]
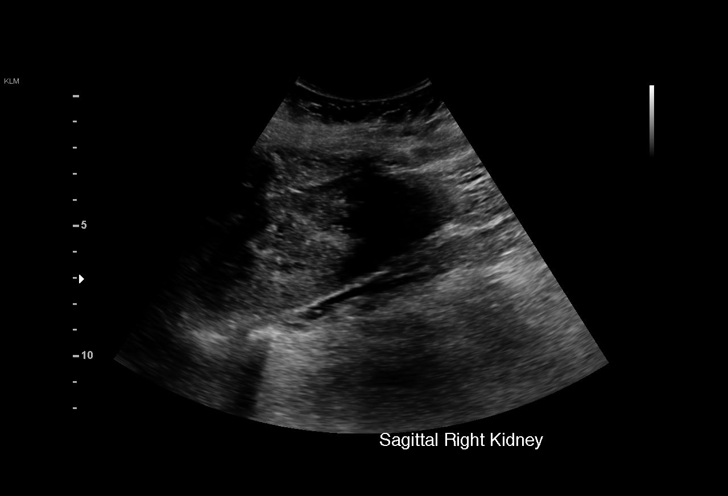
[im 6/26]
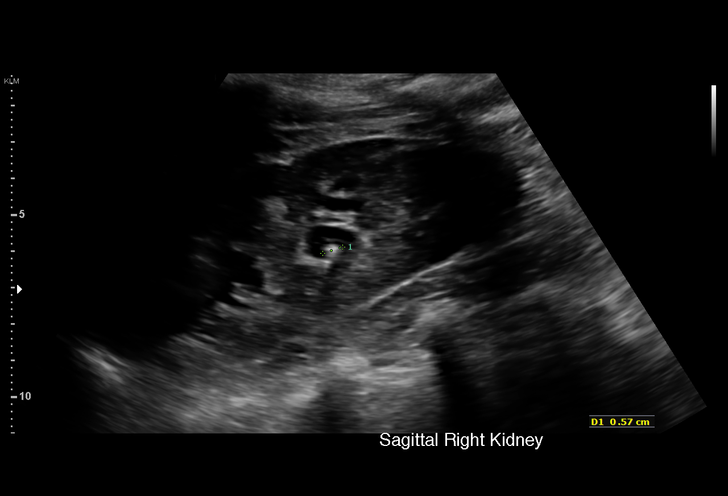
[im 8/26]
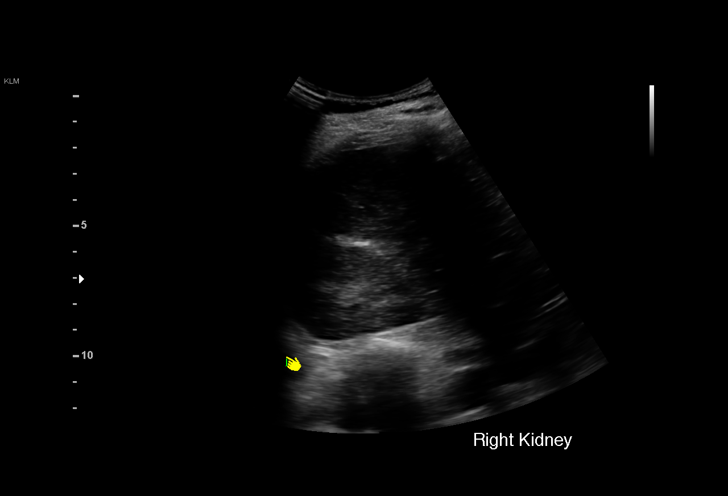
[im 10/26]
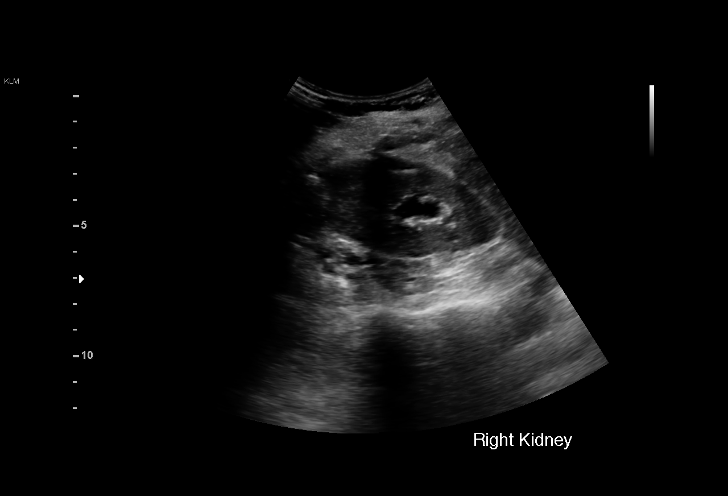
[im 11/26]
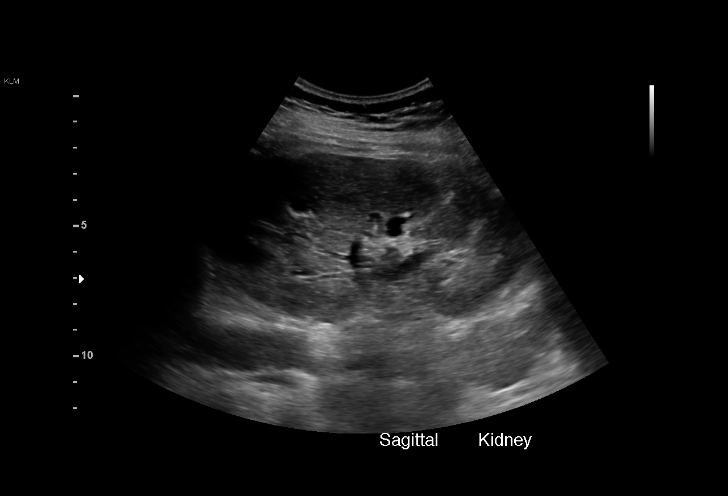
[im 13/26]
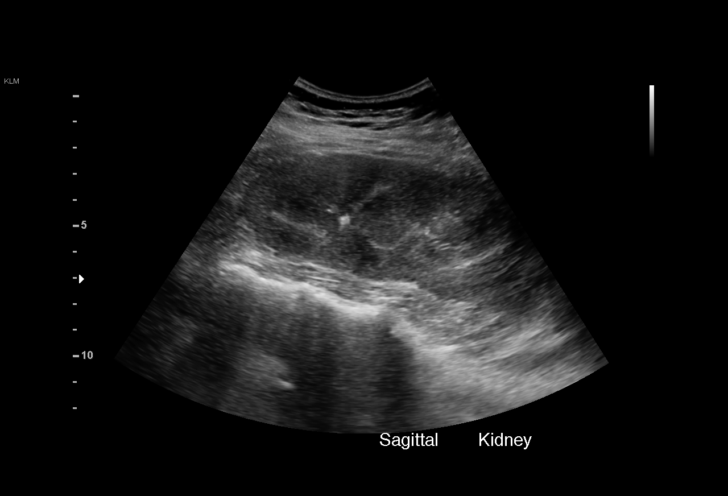
[im 15/26]
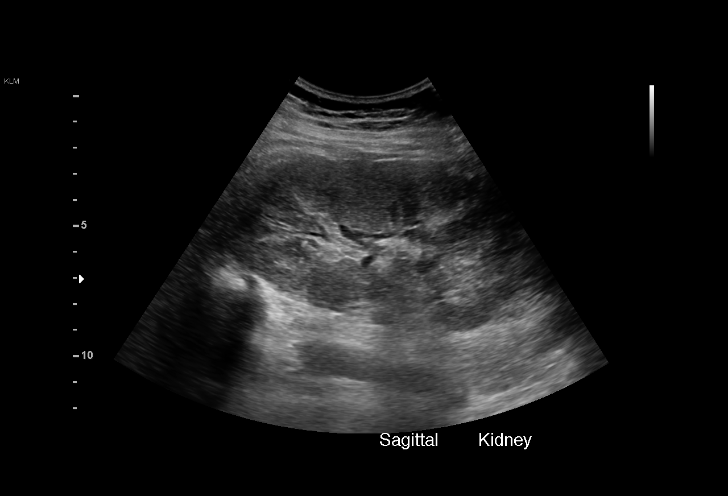
[im 16/26]
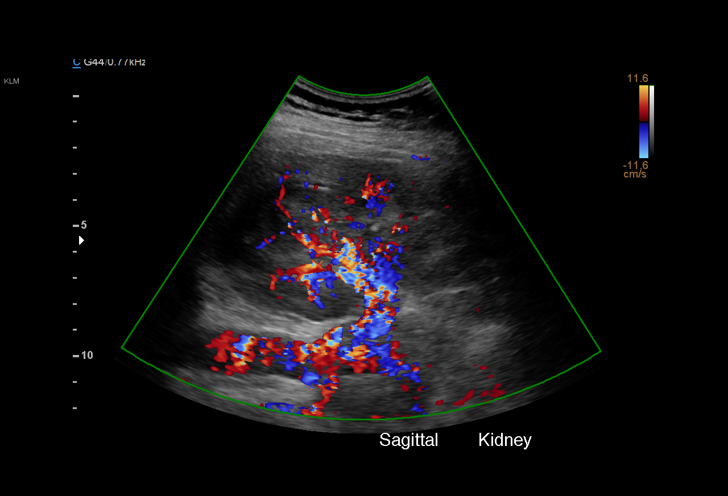
[im 18/26]
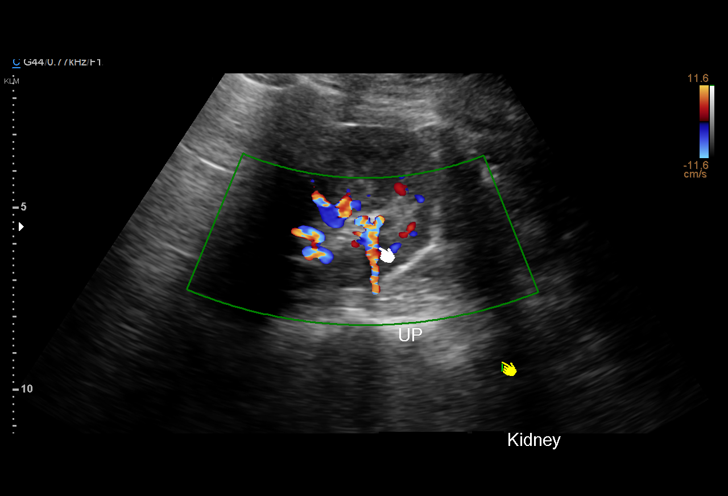
[im 20/26]
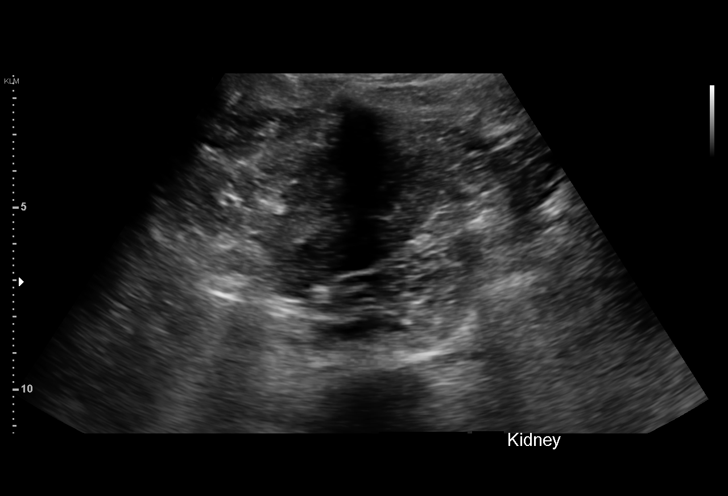
[im 21/26]
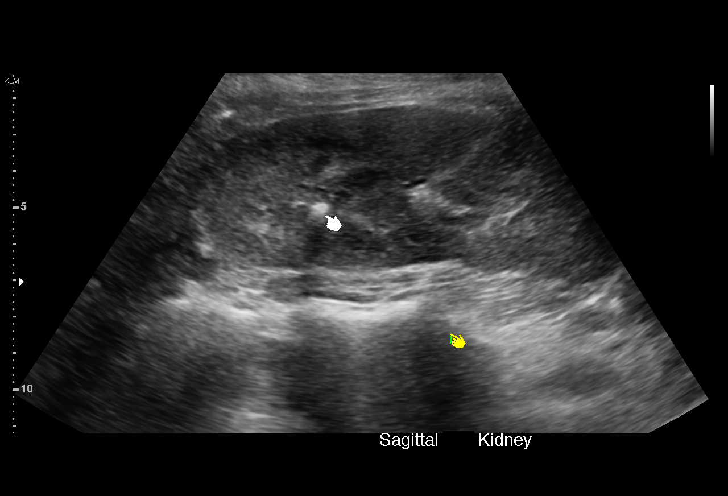
[im 23/26]
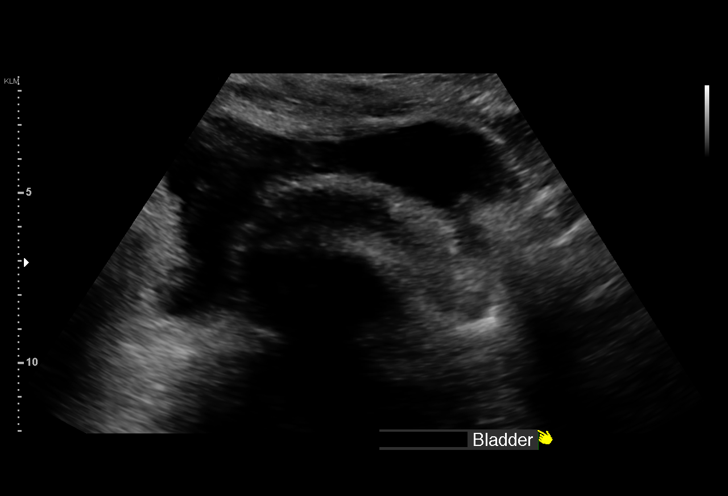
[im 26/26]
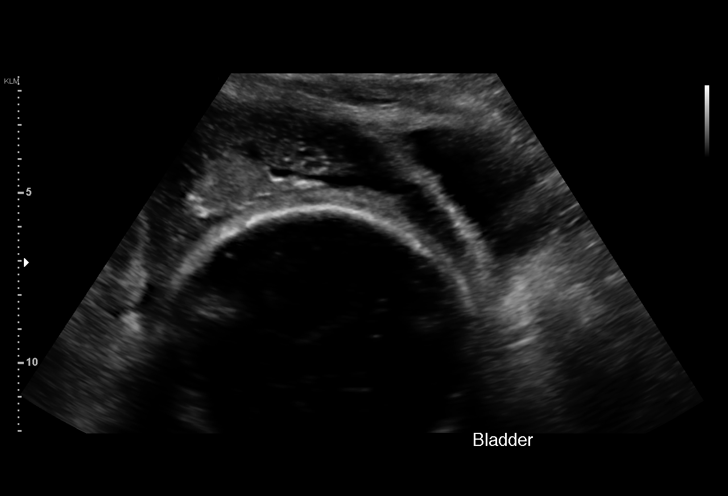

[15 of 25 positions shown; findings below may reference images not displayed]

FINDINGS: Right Kidney:

Length: 13.8. Mild to moderate hydronephrosis. There is a 6 mm
calculus within the right renal pelvis. No mass identified.

Left Kidney:

Length: 12.4. Small stone within the upper pole measures 5 mm.
Echogenicity within normal limits. No mass or hydronephrosis
visualized.

Bladder:

Appears normal for degree of bladder distention.
IMPRESSION: 1. Mild-to-moderate right hydronephrosis. There is a stone within
the right renal pelvis measuring 6 mm.
2. Left upper pole calculus measures 5 mm.  No left hydronephrosis.

## 2022-09-04 ENCOUNTER — Other Ambulatory Visit: Payer: Self-pay | Admitting: *Deleted

## 2022-09-04 DIAGNOSIS — R1310 Dysphagia, unspecified: Secondary | ICD-10-CM

## 2022-09-18 ENCOUNTER — Ambulatory Visit
Admission: RE | Admit: 2022-09-18 | Discharge: 2022-09-18 | Disposition: A | Discharge status: Home / Self Care | Payer: Self-pay | Source: Ambulatory Visit | Attending: *Deleted | Admitting: *Deleted

## 2022-09-18 DIAGNOSIS — R1310 Dysphagia, unspecified: Secondary | ICD-10-CM

## 2023-12-02 ENCOUNTER — Institutional Professional Consult (permissible substitution) (INDEPENDENT_AMBULATORY_CARE_PROVIDER_SITE_OTHER): Payer: Self-pay | Admitting: Otolaryngology
